# Patient Record
Sex: Female | Born: 1955 | Race: White | Hispanic: No | Marital: Married | Smoking: Former smoker
Health system: Southern US, Community
[De-identification: ages and names within clinical notes are randomized; demographics above are authoritative.]

## PROBLEM LIST (undated history)

## (undated) DIAGNOSIS — I8393 Asymptomatic varicose veins of bilateral lower extremities: Secondary | ICD-10-CM

## (undated) DIAGNOSIS — I10 Essential (primary) hypertension: Secondary | ICD-10-CM

## (undated) DIAGNOSIS — F419 Anxiety disorder, unspecified: Secondary | ICD-10-CM

## (undated) HISTORY — PX: OOPHORECTOMY: SHX86

## (undated) HISTORY — DX: Asymptomatic varicose veins of bilateral lower extremities: I83.93

## (undated) HISTORY — PX: HERNIA REPAIR: SHX51

## (undated) HISTORY — PX: VARICOSE VEIN SURGERY: SHX832

---

## 2016-04-12 ENCOUNTER — Emergency Department (HOSPITAL_BASED_OUTPATIENT_CLINIC_OR_DEPARTMENT_OTHER)
Admission: EM | Admit: 2016-04-12 | Discharge: 2016-04-12 | Disposition: A | Discharge status: Home / Self Care | Payer: BLUE CROSS/BLUE SHIELD | Attending: Emergency Medicine | Admitting: Emergency Medicine

## 2016-04-12 ENCOUNTER — Emergency Department (HOSPITAL_BASED_OUTPATIENT_CLINIC_OR_DEPARTMENT_OTHER): Payer: BLUE CROSS/BLUE SHIELD

## 2016-04-12 ENCOUNTER — Encounter (HOSPITAL_BASED_OUTPATIENT_CLINIC_OR_DEPARTMENT_OTHER): Payer: Self-pay | Admitting: Emergency Medicine

## 2016-04-12 DIAGNOSIS — G8929 Other chronic pain: Secondary | ICD-10-CM | POA: Diagnosis not present

## 2016-04-12 DIAGNOSIS — Z9889 Other specified postprocedural states: Secondary | ICD-10-CM | POA: Insufficient documentation

## 2016-04-12 DIAGNOSIS — R1032 Left lower quadrant pain: Secondary | ICD-10-CM | POA: Diagnosis not present

## 2016-04-12 DIAGNOSIS — R140 Abdominal distension (gaseous): Secondary | ICD-10-CM | POA: Insufficient documentation

## 2016-04-12 DIAGNOSIS — M549 Dorsalgia, unspecified: Secondary | ICD-10-CM | POA: Diagnosis not present

## 2016-04-12 DIAGNOSIS — I10 Essential (primary) hypertension: Secondary | ICD-10-CM | POA: Insufficient documentation

## 2016-04-12 DIAGNOSIS — Z91040 Latex allergy status: Secondary | ICD-10-CM | POA: Diagnosis not present

## 2016-04-12 DIAGNOSIS — Z79899 Other long term (current) drug therapy: Secondary | ICD-10-CM | POA: Diagnosis not present

## 2016-04-12 DIAGNOSIS — Z793 Long term (current) use of hormonal contraceptives: Secondary | ICD-10-CM | POA: Insufficient documentation

## 2016-04-12 DIAGNOSIS — R1031 Right lower quadrant pain: Secondary | ICD-10-CM | POA: Diagnosis present

## 2016-04-12 DIAGNOSIS — R10814 Left lower quadrant abdominal tenderness: Secondary | ICD-10-CM

## 2016-04-12 HISTORY — DX: Essential (primary) hypertension: I10

## 2016-04-12 LAB — URINALYSIS, ROUTINE W REFLEX MICROSCOPIC
BILIRUBIN URINE: NEGATIVE
GLUCOSE, UA: NEGATIVE mg/dL
Hgb urine dipstick: NEGATIVE
Ketones, ur: NEGATIVE mg/dL
LEUKOCYTES UA: NEGATIVE
Nitrite: NEGATIVE
PROTEIN: NEGATIVE mg/dL
Specific Gravity, Urine: 1.014 (ref 1.005–1.030)
pH: 7 (ref 5.0–8.0)

## 2016-04-12 LAB — CBC WITH DIFFERENTIAL/PLATELET
BASOS PCT: 0 %
Basophils Absolute: 0 10*3/uL (ref 0.0–0.1)
Eosinophils Absolute: 0.1 10*3/uL (ref 0.0–0.7)
Eosinophils Relative: 1 %
HEMATOCRIT: 37.8 % (ref 36.0–46.0)
HEMOGLOBIN: 13.2 g/dL (ref 12.0–15.0)
Lymphocytes Relative: 12 %
Lymphs Abs: 1 10*3/uL (ref 0.7–4.0)
MCH: 34.1 pg — ABNORMAL HIGH (ref 26.0–34.0)
MCHC: 34.9 g/dL (ref 30.0–36.0)
MCV: 97.7 fL (ref 78.0–100.0)
Monocytes Absolute: 0.8 10*3/uL (ref 0.1–1.0)
Monocytes Relative: 10 %
NEUTROS ABS: 6.2 10*3/uL (ref 1.7–7.7)
NEUTROS PCT: 77 %
Platelets: 210 10*3/uL (ref 150–400)
RBC: 3.87 MIL/uL (ref 3.87–5.11)
RDW: 12 % (ref 11.5–15.5)
WBC: 8 10*3/uL (ref 4.0–10.5)

## 2016-04-12 LAB — COMPREHENSIVE METABOLIC PANEL
ALK PHOS: 53 U/L (ref 38–126)
ALT: 23 U/L (ref 14–54)
ANION GAP: 7 (ref 5–15)
AST: 26 U/L (ref 15–41)
Albumin: 3.8 g/dL (ref 3.5–5.0)
BUN: 15 mg/dL (ref 6–20)
CALCIUM: 9 mg/dL (ref 8.9–10.3)
CHLORIDE: 101 mmol/L (ref 101–111)
CO2: 26 mmol/L (ref 22–32)
Creatinine, Ser: 0.69 mg/dL (ref 0.44–1.00)
Glucose, Bld: 109 mg/dL — ABNORMAL HIGH (ref 65–99)
Potassium: 3.8 mmol/L (ref 3.5–5.1)
Sodium: 134 mmol/L — ABNORMAL LOW (ref 135–145)
Total Bilirubin: 1.1 mg/dL (ref 0.3–1.2)
Total Protein: 7.4 g/dL (ref 6.5–8.1)

## 2016-04-12 MED ORDER — IBUPROFEN 400 MG PO TABS
600.0000 mg | ORAL_TABLET | Freq: Once | ORAL | Status: AC
  Administered 2016-04-12: 600 mg via ORAL
  Filled 2016-04-12: qty 1

## 2016-04-12 MED ORDER — IOPAMIDOL (ISOVUE-300) INJECTION 61%
100.0000 mL | Freq: Once | INTRAVENOUS | Status: AC | PRN
  Administered 2016-04-12: 100 mL via INTRAVENOUS

## 2016-04-12 MED ORDER — ACIDOPHILUS PROBIOTIC 10 MG PO TABS: 10.0000 mg | ORAL_TABLET | Freq: Three times a day (TID) | ORAL | Status: AC

## 2016-04-12 NOTE — Discharge Instructions (Signed)
Abdominal Pain, Adult °Many things can cause abdominal pain. Usually, abdominal pain is not caused by a disease and will improve without treatment. It can often be observed and treated at home. Your health care provider will do a physical exam and possibly order blood tests and X-rays to help determine the seriousness of your pain. However, in many cases, more time must pass before a clear cause of the pain can be found. Before that point, your health care provider may not know if you need more testing or further treatment. °HOME CARE INSTRUCTIONS °Monitor your abdominal pain for any changes. The following actions may help to alleviate any discomfort you are experiencing: °· Only take over-the-counter or prescription medicines as directed by your health care provider. °· Do not take laxatives unless directed to do so by your health care provider. °· Try a clear liquid diet (broth, tea, or water) as directed by your health care provider. Slowly move to a bland diet as tolerated. °SEEK MEDICAL CARE IF: °· You have unexplained abdominal pain. °· You have abdominal pain associated with nausea or diarrhea. °· You have pain when you urinate or have a bowel movement. °· You experience abdominal pain that wakes you in the night. °· You have abdominal pain that is worsened or improved by eating food. °· You have abdominal pain that is worsened with eating fatty foods. °· You have a fever. °SEEK IMMEDIATE MEDICAL CARE IF: °· Your pain does not go away within 2 hours. °· You keep throwing up (vomiting). °· Your pain is felt only in portions of the abdomen, such as the right side or the left lower portion of the abdomen. °· You pass bloody or black tarry stools. °MAKE SURE YOU: °· Understand these instructions. °· Will watch your condition. °· Will get help right away if you are not doing well or get worse. °  °This information is not intended to replace advice given to you by your health care provider. Make sure you discuss  any questions you have with your health care provider. °  °Document Released: 09/17/2005 Document Revised: 08/29/2015 Document Reviewed: 08/17/2013 °Elsevier Interactive Patient Education ©2016 [employer] ° °Diarrhea °Diarrhea is frequent loose and watery bowel movements. It can cause you to feel weak and dehydrated. Dehydration can cause you to become tired and thirsty, have a dry mouth, and have decreased urination that often is dark yellow. Diarrhea is a sign of another problem, most often an infection that will not last long. In most cases, diarrhea typically lasts 2-3 days. However, it can last longer if it is a sign of something more serious. It is important to treat your diarrhea as directed by your caregiver to lessen or prevent future episodes of diarrhea. °CAUSES  °Some common causes include: °· Gastrointestinal infections caused by viruses, bacteria, or parasites. °· Food poisoning or food allergies. °· Certain medicines, such as antibiotics, chemotherapy, and laxatives. °· Artificial sweeteners and fructose. °· Digestive disorders. °HOME CARE INSTRUCTIONS °· Ensure adequate fluid intake (hydration): Have 1 cup (8 oz) of fluid for each diarrhea episode. Avoid fluids that contain simple sugars or sports drinks, fruit juices, whole milk products, and sodas. Your urine should be clear or pale yellow if you are drinking enough fluids. Hydrate with an oral rehydration solution that you can purchase at pharmacies, retail stores, and online. You can prepare an oral rehydration solution at home by mixing the following ingredients together: °¨  - tsp table salt. °¨ ¾ tsp baking soda. °¨    tsp salt substitute containing potassium chloride. °¨ 1  tablespoons sugar. °¨ 1 L (34 oz) of water. °· Certain foods and beverages may increase the speed at which food moves through the gastrointestinal (GI) tract. These foods and beverages should be avoided and include: °¨ Caffeinated and alcoholic beverages. °¨ High-fiber  foods, such as raw fruits and vegetables, nuts, seeds, and whole grain breads and cereals. °¨ Foods and beverages sweetened with sugar alcohols, such as xylitol, sorbitol, and mannitol. °· Some foods may be well tolerated and may help thicken stool including: °¨ Starchy foods, such as rice, toast, pasta, low-sugar cereal, oatmeal, grits, baked potatoes, crackers, and bagels. °¨ Bananas. °¨ Applesauce. °· Add probiotic-rich foods to help increase healthy bacteria in the GI tract, such as yogurt and fermented milk products. °· Wash your hands well after each diarrhea episode. °· Only take over-the-counter or prescription medicines as directed by your caregiver. °· Take a warm bath to relieve any burning or pain from frequent diarrhea episodes. °SEEK IMMEDIATE MEDICAL CARE IF:  °· You are unable to keep fluids down. °· You have persistent vomiting. °· You have blood in your stool, or your stools are black and tarry. °· You do not urinate in 6-8 hours, or there is only a small amount of very dark urine. °· You have abdominal pain that increases or localizes. °· You have weakness, dizziness, confusion, or light-headedness. °· You have a severe headache. °· Your diarrhea gets worse or does not get better. °· You have a fever or persistent symptoms for more than 2-3 days. °· You have a fever and your symptoms suddenly get worse. °MAKE SURE YOU:  °· Understand these instructions. °· Will watch your condition. °· Will get help right away if you are not doing well or get worse. °  °This information is not intended to replace advice given to you by your health care provider. Make sure you discuss any questions you have with your health care provider. °  °Document Released: 11/28/2002 Document Revised: 12/29/2014 Document Reviewed: 08/15/2012 °Elsevier Interactive Patient Education ©2016 [employer] Inc. ° °

## 2016-04-12 NOTE — ED Notes (Addendum)
Pt with RLQ abdominal pain with "bloating", pt states she took a laxative yesterday with results but no improvement in pain, pt states if she pushes in on RLQ area she has sharp immediate pains that radiate to back, history of ovarian cyst and bulging disk

## 2016-04-12 NOTE — ED Provider Notes (Signed)
CSN: [id number]     Arrival date & time 04/12/16  0856 History   First MD Initiated Contact with Patient 04/12/16 [phone]     Chief Complaint  Patient presents with  . Abdominal Pain    Amber Pratt is a 60 y.o. female who presents to the ED complaining of right lower quadrant abdominal pain for the past week that has worsened last night. She reports 5/10 pain currently that is worse with touching or movement. She also reports abdominal bloating. She has taken nothing for treatment today. She reports a previous abdominal surgical history of 3 cesarean sections and a hernia repair. She reports taking a laxative yesterday that helped with the bloating and her pain. Her last bowel movement was this morning. She denies fevers, nausea, vomiting, diarrhea, hematemesis, hematochezia, rashes, vaginal bleeding, vaginal discharge, coughing, or SOB.   HPI  Past Medical History  Diagnosis Date  . Hypertension    History reviewed. No pertinent past surgical history. No family history on file. Social History  Substance Use Topics  . Smoking status: None  . Smokeless tobacco: None  . Alcohol Use: None   OB History    No data available     Review of Systems  Constitutional: Negative for fever and chills.  HENT: Negative for congestion and sore throat.   Eyes: Negative for visual disturbance.  Respiratory: Negative for cough, shortness of breath and wheezing.   Cardiovascular: Negative for chest pain and palpitations.  Gastrointestinal: Positive for abdominal pain. Negative for nausea, vomiting, diarrhea and blood in stool.  Genitourinary: Negative for dysuria, urgency, frequency, hematuria, flank pain, decreased urine volume, vaginal bleeding, vaginal discharge and difficulty urinating.  Musculoskeletal: Positive for back pain (chronic ). Negative for neck pain.  Skin: Negative for rash.  Neurological: Negative for headaches.      Allergies  Cephalosporins; Latex; and Sulfa  antibiotics  Home Medications   Prior to Admission medications   Medication Sig Start Date End Date Taking? Authorizing Provider  estradiol (ESTRACE) 0.5 MG tablet Take 0.5 mg by mouth daily.   Yes Historical Provider, MD  FLUoxetine (PROZAC) 20 MG tablet Take 20 mg by mouth daily.   Yes Historical Provider, MD  hydrochlorothiazide (HYDRODIURIL) 25 MG tablet Take 25 mg by mouth daily.   Yes Historical Provider, MD  ibuprofen (ADVIL,MOTRIN) 200 MG tablet Take 200 mg by mouth every 6 (six) hours as needed.   Yes Historical Provider, MD  Lactobacillus (ACIDOPHILUS PROBIOTIC) 10 MG TABS Take 10 mg by mouth 3 (three) times daily. 04/12/16   Everlene Farrier, PA-C   BP 138/90 mmHg  Pulse 78  Temp(Src) 98.2 F (36.8 C) (Oral)  Resp 18  SpO2 100% Physical Exam  Constitutional: She appears well-developed and well-nourished. No distress.  Nontoxic appearing.  HENT:  Head: Normocephalic and atraumatic.  Mouth/Throat: Oropharynx is clear and moist.  Eyes: Conjunctivae are normal. Pupils are equal, round, and reactive to light. Right eye exhibits no discharge. Left eye exhibits no discharge.  Neck: Neck supple.  Cardiovascular: Normal rate, regular rhythm, normal heart sounds and intact distal pulses.  Exam reveals no gallop and no friction rub.   No murmur heard. Pulmonary/Chest: Effort normal and breath sounds normal. No respiratory distress. She has no wheezes. She has no rales.  Abdominal: Soft. Bowel sounds are normal. She exhibits no distension and no mass. There is tenderness. There is rebound. There is no guarding.  Patient's abdomen is soft bowel sounds are present. Patient has right lower quadrant  tenderness to palpation. She also has left lower quadrant tenderness to palpation. No CVA or flank tenderness.  Musculoskeletal: She exhibits no edema.  Lymphadenopathy:    She has no cervical adenopathy.  Neurological: She is alert. Coordination normal.  Skin: Skin is warm and dry. No rash  noted. She is not diaphoretic. No erythema. No pallor.  Psychiatric: She has a normal mood and affect. Her behavior is normal.  Nursing note and vitals reviewed.   ED Course  Procedures (including critical care time) Labs Review Labs Reviewed  COMPREHENSIVE METABOLIC PANEL - Abnormal; Notable for the following:    Sodium 134 (*)    Glucose, Bld 109 (*)    All other components within normal limits  CBC WITH DIFFERENTIAL/PLATELET - Abnormal; Notable for the following:    MCH 34.1 (*)    All other components within normal limits  URINALYSIS, ROUTINE W REFLEX MICROSCOPIC (NOT AT [facility])    Imaging Review Ct Abdomen Pelvis W Contrast  04/12/2016  CLINICAL DATA:  Right lower quadrant pain for several days. Bloating. EXAM: CT ABDOMEN AND PELVIS WITH CONTRAST TECHNIQUE: Multidetector CT imaging of the abdomen and pelvis was performed using the standard protocol following bolus administration of intravenous contrast. CONTRAST:  ISOVUE-300 IOPAMIDOL (ISOVUE-300) INJECTION 61% COMPARISON:  None. FINDINGS: Lower chest:  No acute findings. Hepatobiliary: No masses or other significant abnormality. Gallbladder is unremarkable. Pancreas: No mass, inflammatory changes, or other significant abnormality. Spleen: Within normal limits in size and appearance. Adrenals/Urinary Tract: No masses identified. No evidence of hydronephrosis. Stomach/Bowel: No evidence of obstruction, inflammatory process, or abnormal fluid collections. Normal appendix visualized. Vascular/Lymphatic: No pathologically enlarged lymph nodes. No evidence of abdominal aortic aneurysm. Reproductive: No mass or other significant abnormality. Other: None. Musculoskeletal:  No suspicious bone lesions identified. IMPRESSION: Negative. No evidence of appendicitis or other significant abnormality identified within the abdomen or pelvis. Electronically Signed   By: Myles Rosenthal M.D.   On: 04/12/2016 12:03   I have personally reviewed and evaluated  these images and lab results as part of my medical decision-making.   EKG Interpretation None      Filed Vitals:   04/12/16 0948 04/12/16 1213  BP: 120/99 138/90  Pulse: 88 78  Temp: 98.2 F (36.8 C)   TempSrc: Oral   Resp: 18 18  SpO2: 98% 100%     MDM   Meds given in ED:  Medications  iopamidol (ISOVUE-300) 61 % injection 100 mL (100 mLs Intravenous Contrast Given 04/12/16 1131)  ibuprofen (ADVIL,MOTRIN) tablet 600 mg (600 mg Oral Given 04/12/16 1232)    New Prescriptions   LACTOBACILLUS (ACIDOPHILUS PROBIOTIC) 10 MG TABS    Take 10 mg by mouth 3 (three) times daily.    Final diagnoses:  Right lower quadrant abdominal pain  Left lower quadrant abdominal tenderness    This is a 59 y.o. female who presents to the ED complaining of right lower quadrant abdominal pain for the past week that has worsened last night. She reports 5/10 pain currently that is worse with touching or movement. She also reports abdominal bloating. She has taken nothing for treatment today. She reports a previous abdominal surgical history of 3 cesarean sections and a hernia repair. She reports taking a laxative yesterday that helped with the bloating and her pain. Her last bowel movement was this morning.  On exam the patient is afebrile and nontoxic appearing. Her abdomen is soft and she has moderate left lower quadrant and right lower quadrant tenderness to  palpation. No peritoneal signs. CMP and CBC are unremarkable. Urinalysis is negative for infection. No hematuria. CT abdomen and pelvis is unremarkable. While in the emergency room the patient did have one loose stool. This may be related to her laxative or she may have a diarrheal illness. CT scan is unremarkable. I will provide her a prescription for a probiotic and have her follow closely with primary care and her gastroenterologist. I have low suspicion for ovarian torsion due to history and physical exam. Patient has also had pain for 5 days. I see  no need for further emergent workup at this time. Patient feels comfortable with discharge. I advised the patient to follow-up with their primary care provider this week. I advised the patient to return to the emergency department with new or worsening symptoms or new concerns. The patient verbalized understanding and agreement with plan.   This patient was discussed with and evaluated by Dr. Criss AlvineGoldston who agrees with assessment and plan.     Everlene FarrierWilliam Sharonna Vinje, PA-C 04/12/16 1238  Pricilla LovelessScott Goldston, MD 04/12/16 [phone]

## 2018-10-06 ENCOUNTER — Other Ambulatory Visit: Payer: Self-pay

## 2018-10-06 MED ORDER — SERTRALINE HCL 100 MG PO TABS: 200.0000 mg | ORAL_TABLET | Freq: Every day | ORAL | 0 refills | Status: DC

## 2018-10-26 ENCOUNTER — Ambulatory Visit: Payer: Self-pay | Admitting: Psychiatry

## 2018-10-27 ENCOUNTER — Ambulatory Visit: Payer: BLUE CROSS/BLUE SHIELD | Admitting: Psychiatry

## 2018-10-27 ENCOUNTER — Encounter: Payer: Self-pay | Admitting: Psychiatry

## 2018-10-27 VITALS — BP 118/72 | HR 69

## 2018-10-27 DIAGNOSIS — F331 Major depressive disorder, recurrent, moderate: Secondary | ICD-10-CM

## 2018-10-27 DIAGNOSIS — F422 Mixed obsessional thoughts and acts: Secondary | ICD-10-CM | POA: Diagnosis not present

## 2018-10-27 MED ORDER — CLORAZEPATE DIPOTASSIUM 7.5 MG PO TABS: 3.7500 mg | ORAL_TABLET | Freq: Two times a day (BID) | ORAL | 2 refills | Status: DC | PRN

## 2018-10-27 MED ORDER — VENLAFAXINE HCL ER 75 MG PO CP24: ORAL_CAPSULE | ORAL | Status: DC

## 2018-10-27 MED ORDER — SERTRALINE HCL 100 MG PO TABS: ORAL_TABLET | ORAL | 0 refills | Status: DC

## 2018-10-27 NOTE — Progress Notes (Signed)
Amber Pratt [id number] 12/25/1955 62 y.o.  Subjective:   Patient ID:  Amber Pratt is a 62 y.o. (DOB Oct 22, 1956) female.  Chief Complaint:  Chief Complaint  Patient presents with  . Depression  . Anxiety    HPI Amber Pratt presents to the office today for follow-up of depression and anxiety. She reports that her mood and anxiety have been unimproved with Sertraline. She reports that she had severe diarrhea while taking Zoloft and lost 15-18 lbs. She reports that diarrhea has improved slightly with recent dose decreases. She reports that her mother takes Paxil and sister takes Effexor XR, and another sister takes Prozac, all with good response. She reports that her PCP prescribed Venlafaxine XR and she has not yet started this.   She reports "the anxiety is getting better but I am just depressed." She reports that she awakens with anxiety and depression , and this starts to improve by mid-day. She reports that decreased social interaction after move has had a negative impact on her mood. Reports that she has been feeling "shaky" at times with anxiety. Denies any recent palpitations with anxiety. Denies any full blown panic attacks. Describes feeling anxiety "in my gut" with a nervous feeling. She reports worry and rumination. Fears that husband will leave her because "I can't get it together," despite her husband being very supportive. She reports frequent rumination about current medications not working. She reports that she has been walking about 4 days a week and that this helpful for mood and overall well being. Reports energy is low at times and will need to take naps. She reports having to push herself to do housework. She reports poor concentration with increased anxiety. Denies SI.   Past medication trials: Sertraline-ineffective, diarrhea Prozac-effective for 20 years and then no longer effective. Anafranil-patient reports that it "made me  loopy." BuSpar-helpful Clorazepate-effective  Review of Systems:  Review of Systems  Constitutional: Positive for unexpected weight change.  HENT:       Reports that right eye lid has been swollen upon awakening the last few mornings.   Gastrointestinal: Positive for diarrhea.  Musculoskeletal: Negative for gait problem.  Neurological: Negative for tremors.  Psychiatric/Behavioral:       Please refer to HPI    Medications: I have reviewed the patient's current medications.  Current Outpatient Medications  Medication Sig Dispense Refill  . Ascorbic Acid (VITAMIN C) 100 MG tablet Take 100 mg by mouth daily.    Marland Kitchen aspirin EC 81 MG tablet Take 81 mg by mouth daily.    . busPIRone (BUSPAR) 30 MG tablet Take 30 mg by mouth 2 (two) times daily.    . calcium carbonate (OS-CAL - DOSED IN MG OF ELEMENTAL CALCIUM) 1250 (500 Ca) MG tablet Take 1 tablet by mouth.    Melene Muller ON 11/18/2018] clorazepate (TRANXENE) 7.5 MG tablet Take 0.5 tablets (3.75 mg total) by mouth 2 (two) times daily as needed for anxiety. 30 tablet 2  . ibuprofen (ADVIL,MOTRIN) 200 MG tablet Take 200 mg by mouth every 6 (six) hours as needed.    . Lactobacillus (ACIDOPHILUS PROBIOTIC) 10 MG TABS Take 10 mg by mouth 3 (three) times daily. 30 tablet 0  . Multiple Vitamins-Minerals (HAIR SKIN AND NAILS FORMULA PO) Take by mouth.    . olmesartan (BENICAR) 20 MG tablet   1  . Omega-3 Fatty Acids (FISH OIL) 1000 MG CAPS Take by mouth.    . Vitamin D, Cholecalciferol, 10 MCG (400 UNIT) CAPS Take by mouth.    Marland Kitchen  vitamin E 100 UNIT capsule Take by mouth daily.    Marland Kitchen estradiol (ESTRACE) 0.5 MG tablet Take 0.5 mg by mouth daily.    . hydrochlorothiazide (HYDRODIURIL) 25 MG tablet Take 25 mg by mouth daily.    . sertraline (ZOLOFT) 100 MG tablet Take 1/2 tab x 7 days, then stop 5 tablet 0  . venlafaxine XR (EFFEXOR XR) 75 MG 24 hr capsule Take 1 capsule (75 mg total) by mouth daily with breakfast for 7 days, THEN 2 capsules (150 mg total)  daily with breakfast.     No current facility-administered medications for this visit.     Medication Side Effects: Other: Diarrhea  Allergies:  Allergies  Allergen Reactions  . Cephalosporins   . Latex   . Sulfa Antibiotics     Past Medical History:  Diagnosis Date  . Hypertension   . Varicose veins of both lower extremities     Family History  Problem Relation Age of Onset  . Anxiety disorder Mother   . Depression Mother   . Alcohol abuse Father   . Alcohol abuse Maternal Aunt   . Schizophrenia Maternal Uncle   . Anxiety disorder Sister   . Depression Sister   . Alcohol abuse Sister   . Anxiety disorder Sister   . Depression Sister     Social History   Socioeconomic History  . Marital status: Married    Spouse name: Not on file  . Number of children: Not on file  . Years of education: Not on file  . Highest education level: Not on file  Occupational History  . Not on file  Social Needs  . Financial resource strain: Not on file  . Food insecurity:    Worry: Not on file    Inability: Not on file  . Transportation needs:    Medical: Not on file    Non-medical: Not on file  Tobacco Use  . Smoking status: Former Games developer  . Smokeless tobacco: Never Used  Substance and Sexual Activity  . Alcohol use: Not on file  . Drug use: Not on file  . Sexual activity: Not on file  Lifestyle  . Physical activity:    Days per week: Not on file    Minutes per session: Not on file  . Stress: Not on file  Relationships  . Social connections:    Talks on phone: Not on file    Gets together: Not on file    Attends religious service: Not on file    Active member of club or organization: Not on file    Attends meetings of clubs or organizations: Not on file    Relationship status: Not on file  . Intimate partner violence:    Fear of current or ex partner: Not on file    Emotionally abused: Not on file    Physically abused: Not on file    Forced sexual activity: Not  on file  Other Topics Concern  . Not on file  Social History Narrative  . Not on file    Past Medical History, Surgical history, Social history, and Family history were reviewed and updated as appropriate.   Please see review of systems for further details on the patient's review from today.   Objective:   Physical Exam:  BP 118/72   Pulse 69   Physical Exam  Constitutional: She is oriented to person, place, and time. She appears well-developed. No distress.  Musculoskeletal: She exhibits no deformity.  Neurological: She is  alert and oriented to person, place, and time. Coordination normal.  Psychiatric: Her speech is normal and behavior is normal. Judgment and thought content normal. Her mood appears anxious. Her affect is not angry, not blunt, not labile and not inappropriate. Cognition and memory are normal. She exhibits a depressed mood. She expresses no homicidal and no suicidal ideation. She expresses no suicidal plans and no homicidal plans.  Insight intact. No auditory or visual hallucinations. No delusions.     Lab Review:     Component Value Date/Time   NA 134 (L) 04/12/2016 0930   K 3.8 04/12/2016 0930   CL 101 04/12/2016 0930   CO2 26 04/12/2016 0930   GLUCOSE 109 (H) 04/12/2016 0930   BUN 15 04/12/2016 0930   CREATININE 0.69 04/12/2016 0930   CALCIUM 9.0 04/12/2016 0930   PROT 7.4 04/12/2016 0930   ALBUMIN 3.8 04/12/2016 0930   AST 26 04/12/2016 0930   ALT 23 04/12/2016 0930   ALKPHOS 53 04/12/2016 0930   BILITOT 1.1 04/12/2016 0930   GFRNONAA >60 04/12/2016 0930   GFRAA >60 04/12/2016 0930       Component Value Date/Time   WBC 8.0 04/12/2016 0930   RBC 3.87 04/12/2016 0930   HGB 13.2 04/12/2016 0930   HCT 37.8 04/12/2016 0930   PLT 210 04/12/2016 0930   MCV 97.7 04/12/2016 0930   MCH 34.1 (H) 04/12/2016 0930   MCHC 34.9 04/12/2016 0930   RDW 12.0 04/12/2016 0930   LYMPHSABS 1.0 04/12/2016 0930   MONOABS 0.8 04/12/2016 0930   EOSABS 0.1  04/12/2016 0930   BASOSABS 0.0 04/12/2016 0930    No results found for: POCLITH, LITHIUM   No results found for: PHENYTOIN, PHENOBARB, VALPROATE, CBMZ   .res Assessment: Plan:   Case staffed with Dr. Jennelle Human.  Patient seen for 30 minutes and greater than 50% of visit spent counseling patient regarding potential benefits, risks, and side effects of possible treatment options to include Effexor and Paxil.  Patient reports that she would prefer to start with Effexor XR.  Divided patient with verbal and written instructions regarding cross titration from sertraline to Effexor XR.  Discussed that it would likely take 4 to 5 weeks before she can determine full response from Effexor and that higher doses may be needed.  She is advised to contact office with any tolerability issues. Encouraged pt to contact previous therapist, Bradley Ferris, [facility] to resume therapy.   Mixed obsessional thoughts and acts - Plan: sertraline (ZOLOFT) 100 MG tablet, venlafaxine XR (EFFEXOR XR) 75 MG 24 hr capsule, clorazepate (TRANXENE) 7.5 MG tablet  Moderate episode of recurrent major depressive disorder (HCC) - Plan: sertraline (ZOLOFT) 100 MG tablet, venlafaxine XR (EFFEXOR XR) 75 MG 24 hr capsule  Please see After Visit Summary for patient specific instructions.  Future Appointments  Date Time Provider Department Center  12/01/2018  9:30 AM Corie Chiquito, PMHNP [facility] None    No orders of the defined types were placed in this encounter.     -------------------------------

## 2018-10-27 NOTE — Patient Instructions (Signed)
Decrease Sertraline to 1/2 tablet daily for one week, and then stop.  Start Effexor XR (Venlafaxine ER) 1 capsule daily for one week (while also taking Sertraline 1/2 tab), then increase to 2 capsules daily.

## 2018-11-17 ENCOUNTER — Encounter: Payer: Self-pay | Admitting: Emergency Medicine

## 2018-11-17 DIAGNOSIS — F331 Major depressive disorder, recurrent, moderate: Secondary | ICD-10-CM | POA: Insufficient documentation

## 2018-11-17 DIAGNOSIS — F422 Mixed obsessional thoughts and acts: Secondary | ICD-10-CM | POA: Insufficient documentation

## 2018-11-24 ENCOUNTER — Other Ambulatory Visit: Payer: Self-pay

## 2018-11-24 MED ORDER — BUSPIRONE HCL 30 MG PO TABS: ORAL_TABLET | ORAL | 1 refills | Status: DC

## 2018-12-01 ENCOUNTER — Encounter: Payer: Self-pay | Admitting: Psychiatry

## 2018-12-01 ENCOUNTER — Ambulatory Visit: Payer: BLUE CROSS/BLUE SHIELD | Admitting: Psychiatry

## 2018-12-01 VITALS — BP 131/71 | HR 82

## 2018-12-01 DIAGNOSIS — F902 Attention-deficit hyperactivity disorder, combined type: Secondary | ICD-10-CM

## 2018-12-01 DIAGNOSIS — F422 Mixed obsessional thoughts and acts: Secondary | ICD-10-CM | POA: Diagnosis not present

## 2018-12-01 DIAGNOSIS — F3341 Major depressive disorder, recurrent, in partial remission: Secondary | ICD-10-CM | POA: Diagnosis not present

## 2018-12-01 MED ORDER — BUSPIRONE HCL 30 MG PO TABS: 30.0000 mg | ORAL_TABLET | Freq: Two times a day (BID) | ORAL | 1 refills | Status: DC

## 2018-12-01 MED ORDER — VENLAFAXINE HCL ER 75 MG PO CP24: ORAL_CAPSULE | ORAL | 0 refills | Status: DC

## 2018-12-01 MED ORDER — CLORAZEPATE DIPOTASSIUM 7.5 MG PO TABS: ORAL_TABLET | ORAL | 2 refills | Status: DC

## 2018-12-01 NOTE — Progress Notes (Signed)
[name] [id number] 27-Dec-1955 62 y.o.  Subjective:   Patient ID:  Amber Pratt is a 62 y.o. (DOB 09-15-1956) female.  Chief Complaint:  Chief Complaint  Patient presents with  . Anxiety  . Follow-up    Depression    HPI Amber Pratt presents to the office today for follow-up of anxiety and depression. She reports "I'm a little better." reports that in the morning she wakes up feeling overwhelmed and thinking about all the things she needs to do. Reports that she will experience increased heart rate, increased muscle tension, and some chest tightness. She reports that these s/s seem to resolve when she is around people, such as when she goes to exercise. She reports that "usually by afternoon I am a lot better and by night time, I am fine." Reports that she is trying not to focus on the anxiety and is better able to redirect these thoughts more easily. . Notices some worry about her children at times. Reports that she is no longer as anxious about being home alone while husband is away. Denies any recent intrusive thoughts- "that part of it is a lot better."    Reports that sad mood is "better." Reports that she is no longer experiencing affective dulling and is now able to cry and find this helpful and feels that crying is a release. She reports that her mood is now stable. She reports that she is sleeping well. Reports that she slept better on Sertraline and now has mild difficulty falling asleep. She reports some decrease in her food intake/appetite. She reports energy and motivation have improved. She has joined [employer] and has been working out and visiting family members. Denies SI.   She reports that her child has ADHD. Pt reports that she now questions if she has ADD since she has had long-standing difficulty with concnetration and focus. Recalls frequently looking out the window as a child in school. Reports that she was always fidgeting and would get in trouble for talking in  class and not staying in her seat. She reports that she would get easily distracted and start homework and not complete it.   Notices in the morning she will jump from one task to the other. Reports that she is able to focus on things she is interested in. Procrastinates and especially puts off tasks that are more detailed or not as interesting. Reports that she frequently loses her car keys. Denies difficulty interrupting others, but will ask friends to remind her to tell them something when they are done talking. Reports that she frequently taps her feet. She reports that she will get overwhelmed by certain tasks or knowing where to begin and then will go out and shop instead.   Will be staying in [place] in January.   Past medication trials: Sertraline-ineffective, diarrhea Prozac-effective for 20 years and then no longer effective. Effexor XR Anafranil-patient reports that it "made me loopy." BuSpar-helpful Clorazepate-effective  Review of Systems:  Review of Systems  HENT:       Dry eyes  Eyes: Positive for discharge.  Gastrointestinal: Negative.   Musculoskeletal: Negative for gait problem.       Notices some muscle tension in left shoulder.  Neurological: Negative for tremors.  Psychiatric/Behavioral:       Please refer to HPI    Medications: I have reviewed the patient's current medications.  Current Outpatient Medications  Medication Sig Dispense Refill  . Ascorbic Acid (VITAMIN C) 100 MG tablet Take 100 mg  by mouth daily.    Marland Kitchen aspirin EC 81 MG tablet Take 81 mg by mouth daily.    . busPIRone (BUSPAR) 30 MG tablet Take 1 tablet (30 mg total) by mouth 2 (two) times daily. Take 1 Tablet by mouth Twice daily. 180 tablet 1  . calcium carbonate (OS-CAL - DOSED IN MG OF ELEMENTAL CALCIUM) 1250 (500 Ca) MG tablet Take 1 tablet by mouth.    . clorazepate (TRANXENE) 7.5 MG tablet Take 1/2-1 po BID prn anxiety 30 tablet 2  . estradiol (ESTRACE) 0.5 MG tablet Take 0.5 mg by mouth  daily.    . hydrochlorothiazide (HYDRODIURIL) 25 MG tablet Take 25 mg by mouth daily.    Marland Kitchen ibuprofen (ADVIL,MOTRIN) 200 MG tablet Take 200 mg by mouth every 6 (six) hours as needed.    . Lactobacillus (ACIDOPHILUS PROBIOTIC) 10 MG TABS Take 10 mg by mouth 3 (three) times daily. 30 tablet 0  . Multiple Vitamins-Minerals (HAIR SKIN AND NAILS FORMULA PO) Take by mouth.    . olmesartan (BENICAR) 20 MG tablet   1  . Omega-3 Fatty Acids (FISH OIL) 1000 MG CAPS Take by mouth.    . venlafaxine XR (EFFEXOR XR) 75 MG 24 hr capsule Take 1 capsule (75 mg total) by mouth daily with breakfast for 7 days, THEN 2 capsules (150 mg total) daily with breakfast.    . Vitamin D, Cholecalciferol, 10 MCG (400 UNIT) CAPS Take by mouth.    . vitamin E 100 UNIT capsule Take by mouth daily.    . propranolol (INDERAL) 10 MG tablet Take 10 mg by mouth 2 (two) times daily.    Marland Kitchen venlafaxine XR (EFFEXOR XR) 75 MG 24 hr capsule Take 2-3 capsule po q am 270 capsule 0   No current facility-administered medications for this visit.     Medication Side Effects: Other: Some decrease in sexual desire.   Allergies:  Allergies  Allergen Reactions  . Cephalosporins   . Latex   . Sulfa Antibiotics     Past Medical History:  Diagnosis Date  . Hypertension   . Varicose veins of both lower extremities     Family History  Problem Relation Age of Onset  . Anxiety disorder Mother   . Depression Mother   . Alcohol abuse Father   . Alcohol abuse Maternal Aunt   . Schizophrenia Maternal Uncle   . Anxiety disorder Sister   . Depression Sister   . Alcohol abuse Sister   . Anxiety disorder Sister   . Depression Sister     Social History   Socioeconomic History  . Marital status: Married    Spouse name: Not on file  . Number of children: Not on file  . Years of education: Not on file  . Highest education level: Not on file  Occupational History  . Not on file  Social Needs  . Financial resource strain: Not on file   . Food insecurity:    Worry: Not on file    Inability: Not on file  . Transportation needs:    Medical: Not on file    Non-medical: Not on file  Tobacco Use  . Smoking status: Former Games developer  . Smokeless tobacco: Never Used  Substance and Sexual Activity  . Alcohol use: Not on file  . Drug use: Not on file  . Sexual activity: Not on file  Lifestyle  . Physical activity:    Days per week: Not on file    Minutes per session:  Not on file  . Stress: Not on file  Relationships  . Social connections:    Talks on phone: Not on file    Gets together: Not on file    Attends religious service: Not on file    Active member of club or organization: Not on file    Attends meetings of clubs or organizations: Not on file    Relationship status: Not on file  . Intimate partner violence:    Fear of current or ex partner: Not on file    Emotionally abused: Not on file    Physically abused: Not on file    Forced sexual activity: Not on file  Other Topics Concern  . Not on file  Social History Narrative  . Not on file    Past Medical History, Surgical history, Social history, and Family history were reviewed and updated as appropriate.   Please see review of systems for further details on the patient's review from today.   Objective:   Physical Exam:  BP 131/71   Pulse 82   Physical Exam  Constitutional: She is oriented to person, place, and time. She appears well-developed. No distress.  Musculoskeletal: She exhibits no deformity.  Neurological: She is alert and oriented to person, place, and time. Coordination normal.  Psychiatric: Her speech is normal and behavior is normal. Judgment and thought content normal. Her affect is not angry, not blunt, not labile and not inappropriate. Cognition and memory are normal. She does not exhibit a depressed mood. She expresses no homicidal and no suicidal ideation. She expresses no suicidal plans and no homicidal plans.  Less anxious compared  to past anxious Insight intact. No auditory or visual hallucinations. No delusions.     Lab Review:     Component Value Date/Time   NA 134 (L) 04/12/2016 0930   K 3.8 04/12/2016 0930   CL 101 04/12/2016 0930   CO2 26 04/12/2016 0930   GLUCOSE 109 (H) 04/12/2016 0930   BUN 15 04/12/2016 0930   CREATININE 0.69 04/12/2016 0930   CALCIUM 9.0 04/12/2016 0930   PROT 7.4 04/12/2016 0930   ALBUMIN 3.8 04/12/2016 0930   AST 26 04/12/2016 0930   ALT 23 04/12/2016 0930   ALKPHOS 53 04/12/2016 0930   BILITOT 1.1 04/12/2016 0930   GFRNONAA >60 04/12/2016 0930   GFRAA >60 04/12/2016 0930       Component Value Date/Time   WBC 8.0 04/12/2016 0930   RBC 3.87 04/12/2016 0930   HGB 13.2 04/12/2016 0930   HCT 37.8 04/12/2016 0930   PLT 210 04/12/2016 0930   MCV 97.7 04/12/2016 0930   MCH 34.1 (H) 04/12/2016 0930   MCHC 34.9 04/12/2016 0930   RDW 12.0 04/12/2016 0930   LYMPHSABS 1.0 04/12/2016 0930   MONOABS 0.8 04/12/2016 0930   EOSABS 0.1 04/12/2016 0930   BASOSABS 0.0 04/12/2016 0930    No results found for: POCLITH, LITHIUM   No results found for: PHENYTOIN, PHENOBARB, VALPROATE, CBMZ   .res Assessment: Plan:   Patient seen for 30 minutes and greater than 50% of visit spent counseling patient regarding potential benefits, risks, and side effects of increasing Effexor XR to 225 mg daily versus continuing 150 mg daily since patient reports that she is continue to experience some gradual improvement in signs and symptoms and is uncertain if she is experiencing some sexual side effects.  Also discussed some concern about increasing medication while being away for a month and being unsure of effects of  new meds.  Will write prescription for Effexor XR 75 mg 2 to 3 capsules daily so that patient can continue Effexor XR 150 and milligrams daily and have option to increase to 225 mg in the future when traveling out of state if needed.  Advised patient to contact office if she has questions  about whether or not to increase dose. Continue BuSpar 30 mg twice daily for anxiety. Continue clorazepate for anxiety.  Recommended taking clorazepate immediately upon awakening to prevent escalation of anxiety and to treat anxiety signs and symptoms more aggressively for 7 to 10 days until anxiety stabilizes. Recurrent major depressive disorder, in partial remission (HCC) - Plan: venlafaxine XR (EFFEXOR XR) 75 MG 24 hr capsule  Mixed obsessional thoughts and acts - Plan: venlafaxine XR (EFFEXOR XR) 75 MG 24 hr capsule, busPIRone (BUSPAR) 30 MG tablet, clorazepate (TRANXENE) 7.5 MG tablet, DISCONTINUED: clorazepate (TRANXENE) 7.5 MG tablet  Attention deficit hyperactivity disorder (ADHD), combined type  Please see After Visit Summary for patient specific instructions.  Future Appointments  Date Time Provider Department Center  02/01/2019  9:30 AM Corie Chiquito, PMHNP [facility] None    No orders of the defined types were placed in this encounter.     -------------------------------

## 2018-12-02 MED ORDER — CLORAZEPATE DIPOTASSIUM 7.5 MG PO TABS: ORAL_TABLET | ORAL | 2 refills | Status: DC

## 2019-02-01 ENCOUNTER — Ambulatory Visit: Payer: BLUE CROSS/BLUE SHIELD | Admitting: Psychiatry

## 2019-02-01 ENCOUNTER — Encounter: Payer: Self-pay | Admitting: Psychiatry

## 2019-02-01 VITALS — BP 115/79 | HR 94

## 2019-02-01 DIAGNOSIS — F331 Major depressive disorder, recurrent, moderate: Secondary | ICD-10-CM | POA: Diagnosis not present

## 2019-02-01 DIAGNOSIS — F422 Mixed obsessional thoughts and acts: Secondary | ICD-10-CM

## 2019-02-01 DIAGNOSIS — F3341 Major depressive disorder, recurrent, in partial remission: Secondary | ICD-10-CM

## 2019-02-01 MED ORDER — VENLAFAXINE HCL ER 75 MG PO CP24: ORAL_CAPSULE | ORAL | 0 refills | Status: DC

## 2019-02-01 NOTE — Progress Notes (Signed)
Amber Pratt [id number] 1956-06-29 63 y.o.  Subjective:   Patient ID:  Amber Pratt is a 63 y.o. (DOB 1956/03/04) female.  Chief Complaint:  Chief Complaint  Patient presents with  . Depression  . Anxiety    HPI Amber Pratt presents to the office today for follow-up of depression and anxiety.   She reports that she is "a little better but I still have a ways to be where I want to be." She reports that her PCP increased her Effexor XR about 3 weeks ago. She reports that she is now taking 150 mg po q am and 75 mg po QHS. She reports that she is now sleeping more soundly and wanting to go to bed early. She reports improved sleep quality and duration and is now sleeping 9 hours a night and awakens once to take her dog out.   She reports that her anxiety is better. Reports that she continues to have some anxiety in the morning, particularly after watching the morning news. She reports that she continues to have "that naggy depression." She reports that her mood seems to be ok when she is out and around others and notices sad mood more when she is alone. Has joined the [facility] and has been exercising. Has been going to [facility] a few times a week to see friends and her mother. "This particular depression has lasted a little longer." She reports that her energy has "been fine." She reports that she is occasionally wanting to doze in the afternoon when reading. Motivation has been ok. Appetite has been good. She reports adequate concentration "as long as it is quiet" and reports that she is distracted if husband is talking on the phone in the background. "The depression is still there, but it is way lower." Denies SI.  Some worry about her 78 yo son who has had several head injuries and has mentioned that he has been having some depression.  Taking 1/2 tab po q am of Tranxene and no longer taking second dose.   Has not seen Bradley Ferris, [facility] recently.   Past medication  trials: Sertraline-ineffective, diarrhea Prozac-effective for 20 years and then no longer effective. Effexor XR Anafranil-patient reports that it "made me loopy." BuSpar-helpful Clorazepate-effective Propranolol  Review of Systems:  Review of Systems  Musculoskeletal: Positive for arthralgias. Negative for gait problem.       Reports some pain in thumb joints  Neurological: Negative for tremors.  Psychiatric/Behavioral:       Please refer to HPI    Medications: I have reviewed the patient's current medications.  Current Outpatient Medications  Medication Sig Dispense Refill  . Ascorbic Acid (VITAMIN C) 100 MG tablet Take 100 mg by mouth daily.    Marland Kitchen aspirin EC 81 MG tablet Take 81 mg by mouth 2 (two) times a week.     . busPIRone (BUSPAR) 30 MG tablet Take 1 tablet (30 mg total) by mouth 2 (two) times daily. Take 1 Tablet by mouth Twice daily. 180 tablet 1  . calcium carbonate (OS-CAL - DOSED IN MG OF ELEMENTAL CALCIUM) 1250 (500 Ca) MG tablet Take 1 tablet by mouth.    . clorazepate (TRANXENE) 7.5 MG tablet Take 1/2-1 po BID prn anxiety 30 tablet 2  . estradiol (ESTRACE) 0.5 MG tablet Take 0.5 mg by mouth daily.    . hydrochlorothiazide (HYDRODIURIL) 25 MG tablet Take 25 mg by mouth daily.    Marland Kitchen ibuprofen (ADVIL,MOTRIN) 200 MG tablet Take 200 mg by mouth  every 6 (six) hours as needed.    . Lactobacillus (ACIDOPHILUS PROBIOTIC) 10 MG TABS Take 10 mg by mouth 3 (three) times daily. 30 tablet 0  . Multiple Vitamins-Minerals (HAIR SKIN AND NAILS FORMULA PO) Take by mouth.    . olmesartan (BENICAR) 20 MG tablet   1  . Omega-3 Fatty Acids (FISH OIL) 1000 MG CAPS Take by mouth.    . venlafaxine XR (EFFEXOR XR) 75 MG 24 hr capsule Take 2 capsules po q am and 1 capsule po qhs 270 capsule 0  . Vitamin D, Cholecalciferol, 10 MCG (400 UNIT) CAPS Take by mouth.    . vitamin E 100 UNIT capsule Take by mouth daily.    . propranolol (INDERAL) 10 MG tablet Take 10 mg by mouth 2 (two) times daily.      No current facility-administered medications for this visit.     Medication Side Effects: None  Allergies:  Allergies  Allergen Reactions  . Cephalosporins   . Latex   . Sulfa Antibiotics     Past Medical History:  Diagnosis Date  . Hypertension   . Varicose veins of both lower extremities     Family History  Problem Relation Age of Onset  . Anxiety disorder Mother   . Depression Mother   . Alcohol abuse Father   . Alcohol abuse Maternal Aunt   . Schizophrenia Maternal Uncle   . Anxiety disorder Sister   . Depression Sister   . Alcohol abuse Sister   . Anxiety disorder Sister   . Depression Sister     Social History   Socioeconomic History  . Marital status: Married    Spouse name: Not on file  . Number of children: Not on file  . Years of education: Not on file  . Highest education level: Not on file  Occupational History  . Not on file  Social Needs  . Financial resource strain: Not on file  . Food insecurity:    Worry: Not on file    Inability: Not on file  . Transportation needs:    Medical: Not on file    Non-medical: Not on file  Tobacco Use  . Smoking status: Former Games developermoker  . Smokeless tobacco: Never Used  Substance and Sexual Activity  . Alcohol use: Not on file  . Drug use: Not on file  . Sexual activity: Not on file  Lifestyle  . Physical activity:    Days per week: Not on file    Minutes per session: Not on file  . Stress: Not on file  Relationships  . Social connections:    Talks on phone: Not on file    Gets together: Not on file    Attends religious service: Not on file    Active member of club or organization: Not on file    Attends meetings of clubs or organizations: Not on file    Relationship status: Not on file  . Intimate partner violence:    Fear of current or ex partner: Not on file    Emotionally abused: Not on file    Physically abused: Not on file    Forced sexual activity: Not on file  Other Topics Concern  .  Not on file  Social History Narrative  . Not on file    Past Medical History, Surgical history, Social history, and Family history were reviewed and updated as appropriate.   Please see review of systems for further details on the patient's review from today.  Objective:   Physical Exam:  BP 115/79   Pulse 94   Physical Exam Constitutional:      General: She is not in acute distress.    Appearance: She is well-developed.  Musculoskeletal:        General: No deformity.  Neurological:     Mental Status: She is alert and oriented to person, place, and time.     Coordination: Coordination normal.  Psychiatric:        Attention and Perception: Attention and perception normal. She does not perceive auditory or visual hallucinations.        Mood and Affect: Mood is anxious. Mood is not depressed. Affect is not labile, blunt, angry or inappropriate.        Speech: Speech normal.        Behavior: Behavior normal.        Thought Content: Thought content normal. Thought content does not include homicidal or suicidal ideation. Thought content does not include homicidal or suicidal plan.        Cognition and Memory: Cognition and memory normal.        Judgment: Judgment normal.     Comments: Insight intact. No delusions.      Lab Review:     Component Value Date/Time   NA 134 (L) 04/12/2016 0930   K 3.8 04/12/2016 0930   CL 101 04/12/2016 0930   CO2 26 04/12/2016 0930   GLUCOSE 109 (H) 04/12/2016 0930   BUN 15 04/12/2016 0930   CREATININE 0.69 04/12/2016 0930   CALCIUM 9.0 04/12/2016 0930   PROT 7.4 04/12/2016 0930   ALBUMIN 3.8 04/12/2016 0930   AST 26 04/12/2016 0930   ALT 23 04/12/2016 0930   ALKPHOS 53 04/12/2016 0930   BILITOT 1.1 04/12/2016 0930   GFRNONAA >60 04/12/2016 0930   GFRAA >60 04/12/2016 0930       Component Value Date/Time   WBC 8.0 04/12/2016 0930   RBC 3.87 04/12/2016 0930   HGB 13.2 04/12/2016 0930   HCT 37.8 04/12/2016 0930   PLT 210  04/12/2016 0930   MCV 97.7 04/12/2016 0930   MCH 34.1 (H) 04/12/2016 0930   MCHC 34.9 04/12/2016 0930   RDW 12.0 04/12/2016 0930   LYMPHSABS 1.0 04/12/2016 0930   MONOABS 0.8 04/12/2016 0930   EOSABS 0.1 04/12/2016 0930   BASOSABS 0.0 04/12/2016 0930    No results found for: POCLITH, LITHIUM   No results found for: PHENYTOIN, PHENOBARB, VALPROATE, CBMZ   .res Assessment: Plan:   Discussed that more time is needed to determine response to recent increase in Effexor XR and that itmay be helpful for mood and anxiety since she has had a partial improvement in mood and anxiety s/s at lower doses.  Continue Effexor XR 225 mg po qd for depression and anxiety.  Continue Buspar for anxiety.  Continue Clorazepate prn anxiety.  Recurrent major depressive disorder, in partial remission (HCC) - Plan: venlafaxine XR (EFFEXOR XR) 75 MG 24 hr capsule  Mixed obsessional thoughts and acts - Plan: venlafaxine XR (EFFEXOR XR) 75 MG 24 hr capsule  Moderate episode of recurrent major depressive disorder (HCC)  Please see After Visit Summary for patient specific instructions.  Future Appointments  Date Time Provider Department Center  04/19/2019 10:00 AM Corie Chiquitoarter, [place] Audia, PMHNP CP-CP None    No orders of the defined types were placed in this encounter.     -------------------------------

## 2019-04-19 ENCOUNTER — Other Ambulatory Visit: Payer: Self-pay

## 2019-04-19 ENCOUNTER — Encounter: Payer: Self-pay | Admitting: Psychiatry

## 2019-04-19 ENCOUNTER — Ambulatory Visit (INDEPENDENT_AMBULATORY_CARE_PROVIDER_SITE_OTHER): Payer: BLUE CROSS/BLUE SHIELD | Admitting: Psychiatry

## 2019-04-19 DIAGNOSIS — F902 Attention-deficit hyperactivity disorder, combined type: Secondary | ICD-10-CM

## 2019-04-19 DIAGNOSIS — F422 Mixed obsessional thoughts and acts: Secondary | ICD-10-CM

## 2019-04-19 DIAGNOSIS — F3341 Major depressive disorder, recurrent, in partial remission: Secondary | ICD-10-CM

## 2019-04-19 MED ORDER — VENLAFAXINE HCL ER 75 MG PO CP24: ORAL_CAPSULE | ORAL | 0 refills | Status: DC

## 2019-04-19 MED ORDER — CLORAZEPATE DIPOTASSIUM 7.5 MG PO TABS: ORAL_TABLET | ORAL | 2 refills | Status: DC

## 2019-04-19 MED ORDER — BUSPIRONE HCL 30 MG PO TABS: 30.0000 mg | ORAL_TABLET | Freq: Two times a day (BID) | ORAL | 1 refills | Status: DC

## 2019-04-19 NOTE — Progress Notes (Signed)
Amber Pratt [id number] 1956-06-09 63 y.o.  Virtual Visit via Telephone Note  I connected with@ on 04/19/19 at 10:00 AM EDT by telephone and verified that I am speaking with the correct person using two identifiers.   I discussed the limitations, risks, security and privacy concerns of performing an evaluation and management service by telephone and the availability of in person appointments. I also discussed with the patient that there may be a patient responsible charge related to this service. The patient expressed understanding and agreed to proceed.   I discussed the assessment and treatment plan with the patient. The patient was provided an opportunity to ask questions and all were answered. The patient agreed with the plan and demonstrated an understanding of the instructions.   The patient was advised to call back or seek an in-person evaluation if the symptoms worsen or if the condition fails to improve as anticipated.  I provided 30 minutes of non-face-to-face time during this encounter.  The patient was located at home.  The provider was located at home.   Corie Chiquito, PMHNP   Subjective:   Patient ID:  Amber Pratt is a 63 y.o. (DOB 10/08/1956) female.  Chief Complaint:  Chief Complaint  Patient presents with  . Follow-up    Anxiety, Depression    HPI Amber Pratt presents for follow-up of anxiety and depression. She reports that she has been doing well- "I'm doing fine"- and had thought she would be able to reduce Effexor XR and then decided to continue when pandemic started.   She reports that she has been having some memory/cognitive issues that may be related to Effexor. She reports thinking about something she needs to do, then feeling anxious, and then feeling "foggy." She reports that "it's not all the time." She reports that she has obsessive thoughts, particularly about her health. Reports that she initially has more anxiety about getting COVID 19.   She  reports that she has been walking frequently and thinks this is helpful for her mood and anxiety. Reports that her husband has been having difficulty with pandemic due to his work being affected and not being able to spend time in [place]. She reports that when he is more anxious that she experiences some increased anxiety. She reports having some anxiety about 2 of her children living in areas where COVID incidence is higher, particularly since one son was refusing to wear a mask.   She reports that her mood has been "pretty good." She reports that her mood remains stable if she stays busy. She reports, "I'm happier." She reports that when she is productive she has less time to dwell on negative thoughts. She reports that her energy and motivation have been adequate. She reports that recently her dog has been a source of comfort. She reports that her sleep has been good. She reports that her appetite is "great" and has gained 5 lbs after losing weight with depression. Denies SI.  She reports that she is taking Clorazepate 1/2 tab po q am and 1/2 tab in the afternoon. She notices that she is better able to focus after taking it. Has not been taking Propranolol. Reports that she takes Buspar 30 mg po q am and 15 mg po QHS  Past medication trials: Sertraline-ineffective, diarrhea Prozac-effective for 20 years and then no longer effective. Effexor XR Anafranil-patient reports that it "made me loopy." BuSpar-helpful Clorazepate-effective Propranolol  Review of Systems:  Review of Systems  Musculoskeletal: Negative for gait problem.  Reports that she has been having some shoulder, thumb, and hip pain. Reports that she saw medical provider and labs were WNL. Reports that she is going to have imaging today.  Neurological: Negative for tremors.  Psychiatric/Behavioral:       Please refer to HPI    Medications: I have reviewed the patient's current medications.  Current Outpatient Medications   Medication Sig Dispense Refill  . Ascorbic Acid (VITAMIN C) 100 MG tablet Take 100 mg by mouth daily.    Marland Kitchen. aspirin EC 81 MG tablet Take 81 mg by mouth 2 (two) times a week.     . busPIRone (BUSPAR) 30 MG tablet Take 1 tablet (30 mg total) by mouth 2 (two) times daily. Take 1 Tablet by mouth Twice daily. 180 tablet 1  . calcium carbonate (OS-CAL - DOSED IN MG OF ELEMENTAL CALCIUM) 1250 (500 Ca) MG tablet Take 1 tablet by mouth.    . clorazepate (TRANXENE) 7.5 MG tablet Take 1/2-1 po BID prn anxiety 30 tablet 2  . diclofenac (VOLTAREN) 75 MG EC tablet Take by mouth.    . estradiol (ESTRACE) 0.5 MG tablet Take 0.5 mg by mouth daily.    . hydrochlorothiazide (HYDRODIURIL) 25 MG tablet Take 25 mg by mouth daily.    Marland Kitchen. ibuprofen (ADVIL,MOTRIN) 200 MG tablet Take 200 mg by mouth every 6 (six) hours as needed.    . Lactobacillus (ACIDOPHILUS PROBIOTIC) 10 MG TABS Take 10 mg by mouth 3 (three) times daily. 30 tablet 0  . Multiple Vitamins-Minerals (HAIR SKIN AND NAILS FORMULA PO) Take by mouth.    . olmesartan (BENICAR) 20 MG tablet   1  . Omega-3 Fatty Acids (FISH OIL) 1000 MG CAPS Take by mouth.    . venlafaxine XR (EFFEXOR XR) 75 MG 24 hr capsule Take 2 capsules po q am and 1 capsule po qhs 270 capsule 0  . Vitamin D, Cholecalciferol, 10 MCG (400 UNIT) CAPS Take by mouth.    . vitamin E 100 UNIT capsule Take by mouth daily.    . propranolol (INDERAL) 10 MG tablet Take 10 mg by mouth 2 (two) times daily.     No current facility-administered medications for this visit.     Medication Side Effects: Other: Occ feeling "foggy"  Allergies:  Allergies  Allergen Reactions  . Cephalosporins   . Latex   . Sulfa Antibiotics     Past Medical History:  Diagnosis Date  . Hypertension   . Varicose veins of both lower extremities     Family History  Problem Relation Age of Onset  . Anxiety disorder Mother   . Depression Mother   . Alcohol abuse Father   . Alcohol abuse Maternal Aunt   .  Schizophrenia Maternal Uncle   . Anxiety disorder Sister   . Depression Sister   . Alcohol abuse Sister   . Anxiety disorder Sister   . Depression Sister     Social History   Socioeconomic History  . Marital status: Married    Spouse name: Not on file  . Number of children: Not on file  . Years of education: Not on file  . Highest education level: Not on file  Occupational History  . Not on file  Social Needs  . Financial resource strain: Not on file  . Food insecurity:    Worry: Not on file    Inability: Not on file  . Transportation needs:    Medical: Not on file    Non-medical: Not  on file  Tobacco Use  . Smoking status: Former Games developer  . Smokeless tobacco: Never Used  Substance and Sexual Activity  . Alcohol use: Not on file  . Drug use: Not on file  . Sexual activity: Not on file  Lifestyle  . Physical activity:    Days per week: Not on file    Minutes per session: Not on file  . Stress: Not on file  Relationships  . Social connections:    Talks on phone: Not on file    Gets together: Not on file    Attends religious service: Not on file    Active member of club or organization: Not on file    Attends meetings of clubs or organizations: Not on file    Relationship status: Not on file  . Intimate partner violence:    Fear of current or ex partner: Not on file    Emotionally abused: Not on file    Physically abused: Not on file    Forced sexual activity: Not on file  Other Topics Concern  . Not on file  Social History Narrative  . Not on file    Past Medical History, Surgical history, Social history, and Family history were reviewed and updated as appropriate.   Please see review of systems for further details on the patient's review from today.   Objective:   Physical Exam:  There were no vitals taken for this visit.  Physical Exam Neurological:     Mental Status: She is alert and oriented to person, place, and time.     Cranial Nerves: No  dysarthria.  Psychiatric:        Attention and Perception: Attention normal.        Mood and Affect: Mood is anxious. Mood is not depressed.        Speech: Speech normal.        Behavior: Behavior is cooperative.        Thought Content: Thought content normal. Thought content is not paranoid or delusional. Thought content does not include homicidal or suicidal ideation. Thought content does not include homicidal or suicidal plan.        Cognition and Memory: Cognition and memory normal.        Judgment: Judgment normal.     Lab Review:     Component Value Date/Time   NA 134 (L) 04/12/2016 0930   K 3.8 04/12/2016 0930   CL 101 04/12/2016 0930   CO2 26 04/12/2016 0930   GLUCOSE 109 (H) 04/12/2016 0930   BUN 15 04/12/2016 0930   CREATININE 0.69 04/12/2016 0930   CALCIUM 9.0 04/12/2016 0930   PROT 7.4 04/12/2016 0930   ALBUMIN 3.8 04/12/2016 0930   AST 26 04/12/2016 0930   ALT 23 04/12/2016 0930   ALKPHOS 53 04/12/2016 0930   BILITOT 1.1 04/12/2016 0930   GFRNONAA >60 04/12/2016 0930   GFRAA >60 04/12/2016 0930       Component Value Date/Time   WBC 8.0 04/12/2016 0930   RBC 3.87 04/12/2016 0930   HGB 13.2 04/12/2016 0930   HCT 37.8 04/12/2016 0930   PLT 210 04/12/2016 0930   MCV 97.7 04/12/2016 0930   MCH 34.1 (H) 04/12/2016 0930   MCHC 34.9 04/12/2016 0930   RDW 12.0 04/12/2016 0930   LYMPHSABS 1.0 04/12/2016 0930   MONOABS 0.8 04/12/2016 0930   EOSABS 0.1 04/12/2016 0930   BASOSABS 0.0 04/12/2016 0930    No results found for: POCLITH, LITHIUM  No results found for: PHENYTOIN, PHENOBARB, VALPROATE, CBMZ   .res Assessment: Plan:   Discussed treatment options with patient and also addressed her concerns about occasional forgetfulness and impaired concentration.  Discussed that concerns with memory and concentration seem to be related to anxiety since she reports noticing the symptoms more when her anxiety is higher and reports that she is not having persistent  issues with concentration and memory.  Also answered patient's questions about likelihood of Prozac working for her after it stopped working with taking it for many years.  Discussed that occasionally an SSRI that had previously pooped out will be effective again after a break in treatment, however in other cases it continues to no longer be effective.  Recommended continuing Effexor XR at this time considering current increased stressors and since overall patient has had an improvement in mood and anxiety signs and symptoms.  Discussed considering dose reduction or change in treatment when stressors improve. Continue Effexor XR 225 mg daily for depression and anxiety. Continue BuSpar 30 mg twice daily for anxiety. Continue clorazepate 7.5 mg 1/2 to 1 tablet p.o. twice daily as needed anxiety. Patient to follow-up in 2 months or sooner if clinically indicated.  Mixed obsessional thoughts and acts - Plan: busPIRone (BUSPAR) 30 MG tablet, clorazepate (TRANXENE) 7.5 MG tablet, venlafaxine XR (EFFEXOR XR) 75 MG 24 hr capsule  Recurrent major depressive disorder, in partial remission (HCC) - Plan: venlafaxine XR (EFFEXOR XR) 75 MG 24 hr capsule  Attention deficit hyperactivity disorder (ADHD), combined type  Please see After Visit Summary for patient specific instructions.  No future appointments.  No orders of the defined types were placed in this encounter.     -------------------------------

## 2019-06-27 ENCOUNTER — Ambulatory Visit: Payer: BLUE CROSS/BLUE SHIELD | Admitting: Psychiatry

## 2019-06-27 ENCOUNTER — Encounter

## 2019-07-28 ENCOUNTER — Encounter (INDEPENDENT_AMBULATORY_CARE_PROVIDER_SITE_OTHER): Payer: Self-pay

## 2019-07-28 ENCOUNTER — Other Ambulatory Visit: Payer: Self-pay

## 2019-07-28 ENCOUNTER — Encounter: Payer: Self-pay | Admitting: Psychiatry

## 2019-07-28 ENCOUNTER — Ambulatory Visit (INDEPENDENT_AMBULATORY_CARE_PROVIDER_SITE_OTHER): Payer: BC Managed Care – PPO | Admitting: Psychiatry

## 2019-07-28 VITALS — BP 117/77 | HR 75

## 2019-07-28 DIAGNOSIS — F331 Major depressive disorder, recurrent, moderate: Secondary | ICD-10-CM | POA: Diagnosis not present

## 2019-07-28 DIAGNOSIS — F422 Mixed obsessional thoughts and acts: Secondary | ICD-10-CM

## 2019-07-28 MED ORDER — CLORAZEPATE DIPOTASSIUM 7.5 MG PO TABS: ORAL_TABLET | ORAL | 2 refills | Status: DC

## 2019-07-28 NOTE — Progress Notes (Signed)
Amber Pratt [id number] 1956-01-05 63 y.o.  Subjective:   Patient ID:  Amber Pratt is a 63 y.o. (DOB [birth year]) female.  Chief Complaint:  Chief Complaint  Patient presents with  . Anxiety  . Depression    HPI [place] Vierling presents to the office today for follow-up of anxiety and depression. She reports that her PCP switched her from Effexor XR to paxil. Pt reports, "I'm having issues remembering stuff." She reports that memory has been worse. She reports that memory issues may be anxiety related. Reports that her concentration is impaired and misses details when her husband is talking. Reports that she had difficulty remembering and attaining some information since childhood. Reports that her mother fell almost 7 weeks ago when pt took her to the bank  and fractured her femur and is now in SNF rehab. Pt reports, "I feel so bad" and thinks about what she could have done to prevent her mother falling and feeling guilty for not having eyes on her mother at the time of the fall. Pt reports that the anxiety around her mother's injury is improving as mother recovers. She reports that "Paxil made me feel better when I first started taking it." She reports that she takes 1/2 tab of Clorazepate and this is helpful for her anxiety and concentration. She reports that she is more anxious and overwhelmed when she does not take it. Feels that Paxil is effective for anxiety. She reports that she has had some mild depression- "not bad." Reports that her depression has improved since starting Paxil. Denies any obsessive thoughts unless she is having obsessive thoughts about memory and cognition. Reports that she also has obsessive thoughts that she will not remember to tell her friends something or be able to keep up with conversations. Reports that she is sleeping well. Appetite has been good. Reports that her energy and motivation have been good. Denies SI.   Reports that husband's business has been slower  since the pandemic.   Reports that her mother and sister take Paxil.   Past medication trials: Sertraline-ineffective, diarrhea Prozac-effective for 20 years and then no longer effective. Effexor XR Paxil Anafranil-patient reports that it "made me loopy." BuSpar-helpful Clorazepate-effective Propranolol  Review of Systems:  Review of Systems  Musculoskeletal: Positive for arthralgias. Negative for gait problem.  Neurological: Negative for tremors.  Psychiatric/Behavioral:       Please refer to HPI    Medications: I have reviewed the patient's current medications.  Current Outpatient Medications  Medication Sig Dispense Refill  . Ascorbic Acid (VITAMIN C) 100 MG tablet Take 100 mg by mouth daily.    . busPIRone (BUSPAR) 30 MG tablet Take 1 tablet (30 mg total) by mouth 2 (two) times daily. Take 1 Tablet by mouth Twice daily. 180 tablet 1  . calcium carbonate (OS-CAL - DOSED IN MG OF ELEMENTAL CALCIUM) 1250 (500 Ca) MG tablet Take 1 tablet by mouth.    . clorazepate (TRANXENE) 7.5 MG tablet Take 1/2-1 po BID prn anxiety 30 tablet 2  . estradiol (ESTRACE) 0.5 MG tablet Take 0.5 mg by mouth daily.    Marland Kitchen ibuprofen (ADVIL,MOTRIN) 200 MG tablet Take 200 mg by mouth every 6 (six) hours as needed.    . Lactobacillus (ACIDOPHILUS PROBIOTIC) 10 MG TABS Take 10 mg by mouth 3 (three) times daily. 30 tablet 0  . Multiple Vitamins-Minerals (HAIR SKIN AND NAILS FORMULA PO) Take by mouth.    . olmesartan (BENICAR) 20 MG tablet   1  .  Omega-3 Fatty Acids (FISH OIL) 1000 MG CAPS Take by mouth.    Marland Kitchen. PARoxetine (PAXIL) 20 MG tablet Take 1 tablet daily for 2 weeks then increase to 2 tablets daily    . Vitamin D, Cholecalciferol, 10 MCG (400 UNIT) CAPS Take by mouth.    . vitamin E 100 UNIT capsule Take by mouth daily.    Marland Kitchen. aspirin EC 81 MG tablet Take 81 mg by mouth 2 (two) times a week.     . hydrochlorothiazide (HYDRODIURIL) 25 MG tablet Take 25 mg by mouth daily.    . propranolol (INDERAL) 10 MG  tablet Take 10 mg by mouth 2 (two) times daily.     No current facility-administered medications for this visit.     Medication Side Effects: Other: Questions if Paxil is contributing to memory issues  Allergies:  Allergies  Allergen Reactions  . Cephalosporins   . Latex   . Sulfa Antibiotics     Past Medical History:  Diagnosis Date  . Hypertension   . Varicose veins of both lower extremities     Family History  Problem Relation Age of Onset  . Anxiety disorder Mother   . Depression Mother   . Alcohol abuse Father   . Alcohol abuse Maternal Aunt   . Schizophrenia Maternal Uncle   . Anxiety disorder Sister   . Depression Sister   . Alcohol abuse Sister   . Anxiety disorder Sister   . Depression Sister     Social History   Socioeconomic History  . Marital status: Married    Spouse name: Not on file  . Number of children: Not on file  . Years of education: Not on file  . Highest education level: Not on file  Occupational History  . Not on file  Social Needs  . Financial resource strain: Not on file  . Food insecurity    Worry: Not on file    Inability: Not on file  . Transportation needs    Medical: Not on file    Non-medical: Not on file  Tobacco Use  . Smoking status: Former Games developermoker  . Smokeless tobacco: Never Used  Substance and Sexual Activity  . Alcohol use: Not on file  . Drug use: Not on file  . Sexual activity: Not on file  Lifestyle  . Physical activity    Days per week: Not on file    Minutes per session: Not on file  . Stress: Not on file  Relationships  . Social Musicianconnections    Talks on phone: Not on file    Gets together: Not on file    Attends religious service: Not on file    Active member of club or organization: Not on file    Attends meetings of clubs or organizations: Not on file    Relationship status: Not on file  . Intimate partner violence    Fear of current or ex partner: Not on file    Emotionally abused: Not on file     Physically abused: Not on file    Forced sexual activity: Not on file  Other Topics Concern  . Not on file  Social History Narrative  . Not on file    Past Medical History, Surgical history, Social history, and Family history were reviewed and updated as appropriate.   Please see review of systems for further details on the patient's review from today.   Objective:   Physical Exam:  BP 117/77   Pulse 75  Physical Exam Constitutional:      General: She is not in acute distress.    Appearance: She is well-developed.  Musculoskeletal:        General: No deformity.  Neurological:     Mental Status: She is alert and oriented to person, place, and time.     Coordination: Coordination normal.  Psychiatric:        Attention and Perception: Attention and perception normal. She does not perceive auditory or visual hallucinations.        Mood and Affect: Mood is anxious. Mood is not depressed. Affect is not labile, blunt, angry or inappropriate.        Speech: Speech normal.        Behavior: Behavior normal.        Thought Content: Thought content normal. Thought content is not paranoid or delusional. Thought content does not include homicidal or suicidal ideation. Thought content does not include homicidal or suicidal plan.        Cognition and Memory: Cognition and memory normal.        Judgment: Judgment normal.     Comments: Insight intact Obsessive thoughts about cognition and memory     Lab Review:     Component Value Date/Time   NA 134 (L) 04/12/2016 0930   K 3.8 04/12/2016 0930   CL 101 04/12/2016 0930   CO2 26 04/12/2016 0930   GLUCOSE 109 (H) 04/12/2016 0930   BUN 15 04/12/2016 0930   CREATININE 0.69 04/12/2016 0930   CALCIUM 9.0 04/12/2016 0930   PROT 7.4 04/12/2016 0930   ALBUMIN 3.8 04/12/2016 0930   AST 26 04/12/2016 0930   ALT 23 04/12/2016 0930   ALKPHOS 53 04/12/2016 0930   BILITOT 1.1 04/12/2016 0930   GFRNONAA >60 04/12/2016 0930   GFRAA >60  04/12/2016 0930       Component Value Date/Time   WBC 8.0 04/12/2016 0930   RBC 3.87 04/12/2016 0930   HGB 13.2 04/12/2016 0930   HCT 37.8 04/12/2016 0930   PLT 210 04/12/2016 0930   MCV 97.7 04/12/2016 0930   MCH 34.1 (H) 04/12/2016 0930   MCHC 34.9 04/12/2016 0930   RDW 12.0 04/12/2016 0930   LYMPHSABS 1.0 04/12/2016 0930   MONOABS 0.8 04/12/2016 0930   EOSABS 0.1 04/12/2016 0930   BASOSABS 0.0 04/12/2016 0930    No results found for: POCLITH, LITHIUM   No results found for: PHENYTOIN, PHENOBARB, VALPROATE, CBMZ   .res Assessment: Plan:   Pt seen for 30 minutes and greater than 50% of session spent counseling pt and coordination of care to include reviewing notes from PCP. Discussed that PCP notes one week ago that her cognition had reportedly improved with Paxil and now pt is questioning if Paxil is worsening cognition. Discussed that pt also had similar concerns re: Effexor. Discussed that obsessive thoughts re: possible worsening cognition may be manifesting as compulsions to re-evaluate and change medications frequently due to intrusive thoughts that medications may be worsening cognition. Discussed that frequent medication changes and having multiple providers manage medications for mood and anxiety may interfere with adequate response to treatment and potentially cause negative effects on cognition. Recommended having one provider manage psychiatric medications and that she may chose to have PCP manage psychiatric medications. Pt reports that she would prefer to continue treatment with this provider.   Discussed that more time is likely needed to determine response to increase in Paxil and changes in Clorazepate, and therefore recommend continuing current plan  of care without changes.   Pt to f/u in 4 weeks or sooner if clinically indicated.  Patient advised to contact office with any questions, adverse effects, or acute worsening in signs and symptoms.    Samara DeistKathryn was seen  today for anxiety and depression.  Diagnoses and all orders for this visit:  Mixed obsessional thoughts and acts -     clorazepate (TRANXENE) 7.5 MG tablet; Take 1/2-1 po BID prn anxiety  Moderate episode of recurrent major depressive disorder (HCC)     Please see After Visit Summary for patient specific instructions.  Future Appointments  Date Time Provider Department Center  08/25/2019 10:00 AM Corie Chiquitoarter, Karalyne Nusser, PMHNP [facility] None    No orders of the defined types were placed in this encounter.   -------------------------------

## 2019-08-25 ENCOUNTER — Ambulatory Visit: Payer: BLUE CROSS/BLUE SHIELD | Admitting: Psychiatry

## 2019-09-05 ENCOUNTER — Telehealth: Payer: Self-pay | Admitting: Psychiatry

## 2019-09-05 ENCOUNTER — Other Ambulatory Visit: Payer: Self-pay

## 2019-09-05 MED ORDER — PAROXETINE HCL 20 MG PO TABS: ORAL_TABLET | ORAL | 0 refills | Status: DC

## 2019-09-05 NOTE — Telephone Encounter (Signed)
Patient called and said that she needs a refill on her paxil 20 mg 2xd to be sent to the [facility] in [place].

## 2019-09-22 ENCOUNTER — Ambulatory Visit (INDEPENDENT_AMBULATORY_CARE_PROVIDER_SITE_OTHER): Payer: BC Managed Care – PPO | Admitting: Psychiatry

## 2019-09-22 ENCOUNTER — Other Ambulatory Visit: Payer: Self-pay

## 2019-09-22 ENCOUNTER — Encounter: Payer: Self-pay | Admitting: Psychiatry

## 2019-09-22 DIAGNOSIS — F422 Mixed obsessional thoughts and acts: Secondary | ICD-10-CM | POA: Diagnosis not present

## 2019-09-22 DIAGNOSIS — F331 Major depressive disorder, recurrent, moderate: Secondary | ICD-10-CM

## 2019-09-22 MED ORDER — PAROXETINE HCL 40 MG PO TABS: 60.0000 mg | ORAL_TABLET | ORAL | 2 refills | Status: DC

## 2019-09-22 NOTE — Progress Notes (Signed)
Amber Pratt [id number] [birth year] 63 y.o.  Subjective:   Patient ID:  Amber Pratt is a 63 y.o. (DOB 11-03-1956) female.  Chief Complaint:  Chief Complaint  Patient presents with  . Anxiety  . Depression    HPI Amber Pratt presents to the office today for follow-up of anxiety. She reports some worry r/t pandemic and about the well-being of her children. She reports her son is considering moving back to [place] from [place]. She reports that Paxil seemed to initially be more effective and no longer seems to be doing as well. She reports some mild depression and anxiety. She attributes depression to the pandemic. She reports that she is not having obsessive thoughts currently. Has been trying to get out and walk again. Has also been gardening. She reports that she has been sleeping well. She reports long-standing anxiety since childhood. Appetite has been good. She reports some recent wt loss. She reports that she has been trying to stay busy and remain active. Reports energy and motivation have been good. Concentration is adequate. Denies SI.   Has been meeting weekly with closest friends. She reports that there was a recent sale with she and her husband's developmental properties. Mother has round the clock care at home and is making improvements since her fall.   She reports that she has tended to have depression around the holidays.   She reports that she has been taking Clorazepate 1/2 tab po q am and 1/2 tab po q afternoon. She reports that she has been decreasing Buspar on her own.   Past medication trials: Sertraline-ineffective, diarrhea Prozac-effective for 20 years and then no longer effective. Effexor XR Paxil Anafranil-patient reports that it "made me loopy." BuSpar-helpful Clorazepate-effective Propranolol  Review of Systems:  Review of Systems  Musculoskeletal: Positive for arthralgias. Negative for gait problem.  Neurological: Negative for tremors.   Psychiatric/Behavioral:       Please refer to HPI    Medications: I have reviewed the patient's current medications.  Current Outpatient Medications  Medication Sig Dispense Refill  . Ascorbic Acid (VITAMIN C) 100 MG tablet Take 100 mg by mouth daily.    Marland Kitchen aspirin EC 81 MG tablet Take 81 mg by mouth 2 (two) times a week.     . busPIRone (BUSPAR) 30 MG tablet Take 1 tablet (30 mg total) by mouth 2 (two) times daily. Take 1 Tablet by mouth Twice daily. (Patient taking differently: Take 15 mg by mouth at bedtime. Take 1 Tablet by mouth Twice daily.) 180 tablet 1  . calcium carbonate (OS-CAL - DOSED IN MG OF ELEMENTAL CALCIUM) 1250 (500 Ca) MG tablet Take 1 tablet by mouth.    . clorazepate (TRANXENE) 7.5 MG tablet Take 1/2-1 po BID prn anxiety 30 tablet 2  . estradiol (ESTRACE) 0.5 MG tablet Take 0.5 mg by mouth daily.    . hydrochlorothiazide (HYDRODIURIL) 25 MG tablet Take 25 mg by mouth daily.    Marland Kitchen ibuprofen (ADVIL,MOTRIN) 200 MG tablet Take 200 mg by mouth every 6 (six) hours as needed.    . Lactobacillus (ACIDOPHILUS PROBIOTIC) 10 MG TABS Take 10 mg by mouth 3 (three) times daily. 30 tablet 0  . Multiple Vitamins-Minerals (HAIR SKIN AND NAILS FORMULA PO) Take by mouth.    . olmesartan (BENICAR) 20 MG tablet   1  . Omega-3 Fatty Acids (FISH OIL) 1000 MG CAPS Take by mouth.    . propranolol (INDERAL) 10 MG tablet Take 10 mg by mouth 2 (two) times daily.    Marland Kitchen  Vitamin D, Cholecalciferol, 10 MCG (400 UNIT) CAPS Take by mouth.    . vitamin E 100 UNIT capsule Take by mouth daily.    Marland Kitchen PARoxetine (PAXIL) 40 MG tablet Take 1.5 tablets (60 mg total) by mouth every morning. 45 tablet 2   No current facility-administered medications for this visit.     Medication Side Effects: Other: Notices increased need for reminders and is unsure if this is r/t meds, anxiety, etc  Allergies:  Allergies  Allergen Reactions  . Cephalosporins   . Latex   . Sulfa Antibiotics     Past Medical History:   Diagnosis Date  . Hypertension   . Varicose veins of both lower extremities     Family History  Problem Relation Age of Onset  . Anxiety disorder Mother   . Depression Mother   . Alcohol abuse Father   . Alcohol abuse Maternal Aunt   . Schizophrenia Maternal Uncle   . Anxiety disorder Sister   . Depression Sister   . Alcohol abuse Sister   . Anxiety disorder Sister   . Depression Sister     Social History   Socioeconomic History  . Marital status: Married    Spouse name: Not on file  . Number of children: Not on file  . Years of education: Not on file  . Highest education level: Not on file  Occupational History  . Not on file  Social Needs  . Financial resource strain: Not on file  . Food insecurity    Worry: Not on file    Inability: Not on file  . Transportation needs    Medical: Not on file    Non-medical: Not on file  Tobacco Use  . Smoking status: Former Games developer  . Smokeless tobacco: Never Used  Substance and Sexual Activity  . Alcohol use: Not on file  . Drug use: Not on file  . Sexual activity: Not on file  Lifestyle  . Physical activity    Days per week: Not on file    Minutes per session: Not on file  . Stress: Not on file  Relationships  . Social Musician on phone: Not on file    Gets together: Not on file    Attends religious service: Not on file    Active member of club or organization: Not on file    Attends meetings of clubs or organizations: Not on file    Relationship status: Not on file  . Intimate partner violence    Fear of current or ex partner: Not on file    Emotionally abused: Not on file    Physically abused: Not on file    Forced sexual activity: Not on file  Other Topics Concern  . Not on file  Social History Narrative  . Not on file    Past Medical History, Surgical history, Social history, and Family history were reviewed and updated as appropriate.   Please see review of systems for further details on the  patient's review from today.   Objective:   Physical Exam:  There were no vitals taken for this visit.  Physical Exam Constitutional:      General: She is not in acute distress.    Appearance: She is well-developed.  Musculoskeletal:        General: No deformity.  Neurological:     Mental Status: She is alert and oriented to person, place, and time.     Coordination: Coordination normal.  Psychiatric:  Attention and Perception: Attention and perception normal. She does not perceive auditory or visual hallucinations.        Mood and Affect: Mood is anxious. Affect is not labile, blunt, angry or inappropriate.        Speech: Speech normal.        Behavior: Behavior normal.        Thought Content: Thought content normal. Thought content is not paranoid or delusional. Thought content does not include homicidal or suicidal ideation. Thought content does not include homicidal or suicidal plan.        Cognition and Memory: Cognition and memory normal.        Judgment: Judgment normal.     Comments: Mood presents as mildly depressed Insight intact. No delusions.      Lab Review:     Component Value Date/Time   NA 134 (L) 04/12/2016 0930   K 3.8 04/12/2016 0930   CL 101 04/12/2016 0930   CO2 26 04/12/2016 0930   GLUCOSE 109 (H) 04/12/2016 0930   BUN 15 04/12/2016 0930   CREATININE 0.69 04/12/2016 0930   CALCIUM 9.0 04/12/2016 0930   PROT 7.4 04/12/2016 0930   ALBUMIN 3.8 04/12/2016 0930   AST 26 04/12/2016 0930   ALT 23 04/12/2016 0930   ALKPHOS 53 04/12/2016 0930   BILITOT 1.1 04/12/2016 0930   GFRNONAA >60 04/12/2016 0930   GFRAA >60 04/12/2016 0930       Component Value Date/Time   WBC 8.0 04/12/2016 0930   RBC 3.87 04/12/2016 0930   HGB 13.2 04/12/2016 0930   HCT 37.8 04/12/2016 0930   PLT 210 04/12/2016 0930   MCV 97.7 04/12/2016 0930   MCH 34.1 (H) 04/12/2016 0930   MCHC 34.9 04/12/2016 0930   RDW 12.0 04/12/2016 0930   LYMPHSABS 1.0 04/12/2016 0930    MONOABS 0.8 04/12/2016 0930   EOSABS 0.1 04/12/2016 0930   BASOSABS 0.0 04/12/2016 0930    No results found for: POCLITH, LITHIUM   No results found for: PHENYTOIN, PHENOBARB, VALPROATE, CBMZ   .res Assessment: Plan:   Patient seen for 30 minutes and greater than 50% of visit spent counseling patient regarding potential benefits, risks, and side effects of increasing Paxil to 60 mg daily.  Discussed that Paxil is indicated for both depression and anxiety and increase in dose may help improve both mood and anxiety signs and symptoms. Answered patient's questions regarding long-term use of clorazepate and discussed that she is currently taking a low dose and has noticed benefit at the same dose without needing to increase dosage and has tolerated it well. Discussed patient's response to buspirone and patient reports that she would like to continue to take buspirone at bedtime since she feels that it is helpful for her sleep.  Will continue BuSpar 15 mg at bedtime at this time and will continue to reevaluate.  Discussed that BuSpar is typically not used for sleep but can occasionally cause some drowsiness.  Discussed that she is likely not experiencing full benefit of BuSpar in terms of anxiety with taking at at bedtime only. Patient to follow-up in 2 months or sooner if clinically indicated. Patient advised to contact office with any questions, adverse effects, or acute worsening in signs and symptoms.  Katerine was seen today for anxiety and depression.  Diagnoses and all orders for this visit:  Mixed obsessional thoughts and acts -     PARoxetine (PAXIL) 40 MG tablet; Take 1.5 tablets (60 mg total) by mouth every  morning.  Moderate episode of recurrent major depressive disorder (HCC) -     PARoxetine (PAXIL) 40 MG tablet; Take 1.5 tablets (60 mg total) by mouth every morning.     Please see After Visit Summary for patient specific instructions.  Future Appointments  Date Time Provider  Department Center  11/22/2019 10:00 AM Corie Chiquitoarter, Shuntae Herzig, PMHNP [facility] None    No orders of the defined types were placed in this encounter.   -------------------------------

## 2019-11-22 ENCOUNTER — Encounter: Payer: Self-pay | Admitting: Psychiatry

## 2019-11-22 ENCOUNTER — Ambulatory Visit (INDEPENDENT_AMBULATORY_CARE_PROVIDER_SITE_OTHER): Payer: BC Managed Care – PPO | Admitting: Psychiatry

## 2019-11-22 ENCOUNTER — Other Ambulatory Visit: Payer: Self-pay

## 2019-11-22 DIAGNOSIS — F422 Mixed obsessional thoughts and acts: Secondary | ICD-10-CM | POA: Diagnosis not present

## 2019-11-22 DIAGNOSIS — F331 Major depressive disorder, recurrent, moderate: Secondary | ICD-10-CM | POA: Diagnosis not present

## 2019-11-22 MED ORDER — PAROXETINE HCL 40 MG PO TABS: 60.0000 mg | ORAL_TABLET | ORAL | 2 refills | Status: DC

## 2019-11-22 NOTE — Progress Notes (Signed)
Amber Pratt [id number] 1956/11/04 63 y.o.  Subjective:   Patient ID:  Amber Pratt is a 63 y.o. (DOB [birth year]) female.  Chief Complaint:  Chief Complaint  Patient presents with  . Follow-up    Anxiety, Depression    HPI Amber Pratt presents to the office today for follow-up of anxiety. She reports that increase in Paxil to 60 mg po qd has been helpful. She reports that Clorazepate is helpful for her anxiety that is typically highest in the morning. Reports that she has been taking 1/2 tab of Buspar once daily.   She reports some worry about her children. Her 3 children were in for Thanksgiving and they all brought large dogs and this triggered her anxiety. She reports anxiety with situation where family is resisting moving out of a house that she and her husband would like to sell. She reports obsessive thoughts have been "ok."   Reports that the holidays have always been a difficult time for her. She reports "I'm a little bit depressed." She reports that her energy and motivation have been good and has been trying to stay busy to help occupy her thoughts a manage anxiety. Has been trying to walk regularly. Chronic difficulty with concentration. Denies SI.   Has not been interested in doing art work recently. Gathers with old friends weekly.   Past medication trials: Sertraline-ineffective, diarrhea Prozac-effective for 20 years and then no longer effective. Effexor XR Paxil Anafranil-patient reports that it "made me loopy." BuSpar-helpful Clorazepate-effective Propranolol   Review of Systems:  Review of Systems  Musculoskeletal: Negative for gait problem.       Shoulder pain. Occ hip pain  Neurological: Positive for headaches.  Psychiatric/Behavioral:       Please refer to HPI    Medications: I have reviewed the patient's current medications.  Current Outpatient Medications  Medication Sig Dispense Refill  . Ascorbic Acid (VITAMIN C) 100 MG tablet Take 100 mg  by mouth daily.    Marland Kitchen aspirin EC 81 MG tablet Take 81 mg by mouth 2 (two) times a week.     . busPIRone (BUSPAR) 30 MG tablet Take 1 tablet (30 mg total) by mouth 2 (two) times daily. Take 1 Tablet by mouth Twice daily. (Patient taking differently: Take 15 mg by mouth at bedtime. Take 1 Tablet by mouth Twice daily.) 180 tablet 1  . calcium carbonate (OS-CAL - DOSED IN MG OF ELEMENTAL CALCIUM) 1250 (500 Ca) MG tablet Take 1 tablet by mouth.    . clorazepate (TRANXENE) 7.5 MG tablet Take 1/2-1 po BID prn anxiety 30 tablet 2  . estradiol (ESTRACE) 0.5 MG tablet Take 0.5 mg by mouth daily.    . hydrochlorothiazide (HYDRODIURIL) 25 MG tablet Take 25 mg by mouth daily.    Marland Kitchen ibuprofen (ADVIL,MOTRIN) 200 MG tablet Take 200 mg by mouth every 6 (six) hours as needed.    . Lactobacillus (ACIDOPHILUS PROBIOTIC) 10 MG TABS Take 10 mg by mouth 3 (three) times daily. 30 tablet 0  . Multiple Vitamins-Minerals (HAIR SKIN AND NAILS FORMULA PO) Take by mouth.    . olmesartan (BENICAR) 20 MG tablet   1  . Omega-3 Fatty Acids (FISH OIL) 1000 MG CAPS Take by mouth.    Marland Kitchen PARoxetine (PAXIL) 40 MG tablet Take 1.5 tablets (60 mg total) by mouth every morning. 45 tablet 2  . propranolol (INDERAL) 10 MG tablet Take 10 mg by mouth 2 (two) times daily.    . Vitamin D, Cholecalciferol, 10 MCG (400  UNIT) CAPS Take by mouth.    . vitamin E 100 UNIT capsule Take by mouth daily.     No current facility-administered medications for this visit.     Medication Side Effects: None  Allergies:  Allergies  Allergen Reactions  . Cephalosporins   . Latex   . Sulfa Antibiotics     Past Medical History:  Diagnosis Date  . Hypertension   . Varicose veins of both lower extremities     Family History  Problem Relation Age of Onset  . Anxiety disorder Mother   . Depression Mother   . Alcohol abuse Father   . Alcohol abuse Maternal Aunt   . Schizophrenia Maternal Uncle   . Anxiety disorder Sister   . Depression Sister   .  Alcohol abuse Sister   . Anxiety disorder Sister   . Depression Sister     Social History   Socioeconomic History  . Marital status: Married    Spouse name: Not on file  . Number of children: Not on file  . Years of education: Not on file  . Highest education level: Not on file  Occupational History  . Not on file  Social Needs  . Financial resource strain: Not on file  . Food insecurity    Worry: Not on file    Inability: Not on file  . Transportation needs    Medical: Not on file    Non-medical: Not on file  Tobacco Use  . Smoking status: Former Games developermoker  . Smokeless tobacco: Never Used  Substance and Sexual Activity  . Alcohol use: Not on file  . Drug use: Not on file  . Sexual activity: Not on file  Lifestyle  . Physical activity    Days per week: Not on file    Minutes per session: Not on file  . Stress: Not on file  Relationships  . Social Musicianconnections    Talks on phone: Not on file    Gets together: Not on file    Attends religious service: Not on file    Active member of club or organization: Not on file    Attends meetings of clubs or organizations: Not on file    Relationship status: Not on file  . Intimate partner violence    Fear of current or ex partner: Not on file    Emotionally abused: Not on file    Physically abused: Not on file    Forced sexual activity: Not on file  Other Topics Concern  . Not on file  Social History Narrative  . Not on file    Past Medical History, Surgical history, Social history, and Family history were reviewed and updated as appropriate.   Please see review of systems for further details on the patient's review from today.   Objective:   Physical Exam:  There were no vitals taken for this visit.  Physical Exam Constitutional:      General: She is not in acute distress.    Appearance: She is well-developed.  Musculoskeletal:        General: No deformity.  Neurological:     Mental Status: She is alert and  oriented to person, place, and time.     Coordination: Coordination normal.  Psychiatric:        Attention and Perception: Attention and perception normal. She does not perceive auditory or visual hallucinations.        Mood and Affect: Affect is not labile, blunt, angry or inappropriate.  Speech: Speech normal.        Behavior: Behavior normal.        Thought Content: Thought content normal. Thought content is not paranoid or delusional. Thought content does not include homicidal or suicidal ideation. Thought content does not include homicidal or suicidal plan.        Cognition and Memory: Cognition and memory normal.        Judgment: Judgment normal.     Comments: Mood presents as less anxious compared to last exam. Mildly depressed.  Insight intact. No delusions.      Lab Review:     Component Value Date/Time   NA 134 (L) 04/12/2016 0930   K 3.8 04/12/2016 0930   CL 101 04/12/2016 0930   CO2 26 04/12/2016 0930   GLUCOSE 109 (H) 04/12/2016 0930   BUN 15 04/12/2016 0930   CREATININE 0.69 04/12/2016 0930   CALCIUM 9.0 04/12/2016 0930   PROT 7.4 04/12/2016 0930   ALBUMIN 3.8 04/12/2016 0930   AST 26 04/12/2016 0930   ALT 23 04/12/2016 0930   ALKPHOS 53 04/12/2016 0930   BILITOT 1.1 04/12/2016 0930   GFRNONAA >60 04/12/2016 0930   GFRAA >60 04/12/2016 0930       Component Value Date/Time   WBC 8.0 04/12/2016 0930   RBC 3.87 04/12/2016 0930   HGB 13.2 04/12/2016 0930   HCT 37.8 04/12/2016 0930   PLT 210 04/12/2016 0930   MCV 97.7 04/12/2016 0930   MCH 34.1 (H) 04/12/2016 0930   MCHC 34.9 04/12/2016 0930   RDW 12.0 04/12/2016 0930   LYMPHSABS 1.0 04/12/2016 0930   MONOABS 0.8 04/12/2016 0930   EOSABS 0.1 04/12/2016 0930   BASOSABS 0.0 04/12/2016 0930    No results found for: POCLITH, LITHIUM   No results found for: PHENYTOIN, PHENOBARB, VALPROATE, CBMZ   .res Assessment: Plan:   Pt seen for 30 minutes and greater than 50% of session spent counseling pt  re: long-term tx plan and strategies to help manage anxiety. Discussed taking Buspar 1/2 tab po BID instead of 1 tab po qd due to short half-life of Buspar. Agreed that there has been some benefit with increase in Paxil to 60 mg po qd and recommend continuing Paxil 60 mg po qd through the holidays since this is usually the most difficult time of the year for her.  Continue Clorazepate for anxiety.  Pt to f/u in 3-4 months or sooner if clinically indicated.  Patient advised to contact office with any questions, adverse effects, or acute worsening in signs and symptoms.  Rudi was seen today for follow-up.  Diagnoses and all orders for this visit:  Mixed obsessional thoughts and acts -     PARoxetine (PAXIL) 40 MG tablet; Take 1.5 tablets (60 mg total) by mouth every morning.  Moderate episode of recurrent major depressive disorder (HCC) -     PARoxetine (PAXIL) 40 MG tablet; Take 1.5 tablets (60 mg total) by mouth every morning.     Please see After Visit Summary for patient specific instructions.  No future appointments.  No orders of the defined types were placed in this encounter.   -------------------------------

## 2020-03-12 ENCOUNTER — Ambulatory Visit (INDEPENDENT_AMBULATORY_CARE_PROVIDER_SITE_OTHER): Payer: BC Managed Care – PPO | Admitting: Psychiatry

## 2020-03-12 ENCOUNTER — Other Ambulatory Visit: Payer: Self-pay

## 2020-03-12 ENCOUNTER — Encounter: Payer: Self-pay | Admitting: Psychiatry

## 2020-03-12 DIAGNOSIS — F331 Major depressive disorder, recurrent, moderate: Secondary | ICD-10-CM | POA: Diagnosis not present

## 2020-03-12 DIAGNOSIS — F422 Mixed obsessional thoughts and acts: Secondary | ICD-10-CM

## 2020-03-12 MED ORDER — BUSPIRONE HCL 30 MG PO TABS: 30.0000 mg | ORAL_TABLET | Freq: Two times a day (BID) | ORAL | 1 refills | Status: DC

## 2020-03-12 MED ORDER — CLORAZEPATE DIPOTASSIUM 7.5 MG PO TABS: ORAL_TABLET | ORAL | 2 refills | Status: DC

## 2020-03-12 MED ORDER — PAROXETINE HCL 40 MG PO TABS: ORAL_TABLET | ORAL | 2 refills | Status: DC

## 2020-03-12 MED ORDER — FLUOXETINE HCL 20 MG PO CAPS: ORAL_CAPSULE | ORAL | 1 refills | Status: DC

## 2020-03-12 NOTE — Progress Notes (Signed)
Amber Pratt [id number] 04-29-1956 64 y.o.  Subjective:   Patient ID:  Amber Pratt is a 64 y.o. (DOB 1956-05-12) female.  Chief Complaint:  Chief Complaint  Patient presents with  . Follow-up    h/o anxiety and depression    HPI [name] presents to the office today for follow-up of anxiety and depression. "I've been struggling." She reports that she has been experiencing depression. Reports that her thinking has been "fuzzy." Her anxiety has been "ok." Has had some worry in response to the pandemic. Anxiety is the worst in the morning and feels "agitated" upon awakening until Clorazepate becomes effective. Denies any recent intrusive thoughts. She reports that she feels socially isolated. Energy and motivation have been low. Interests have been low. Sleeping well. Appetite has been ok. Not eating as much as she used to. Has been losing weight. Currently weighing 126 lbs and usually weighs 135 lbs-140 lbs. Self-image has been lower. Denies SI.   Taking 1/2 of a Clorazepate in the morning and again in the afternoon.   She reports that she received her first injection and is scheduled for 2nd injection next week.   Mother is 29 yo and has dementia. Goes to see mother regularly. She continues to see group of friends weekly for dinner. Concerned that she has not met any new friends in [place] after being there 2 years.   Past medication trials: Sertraline-ineffective, diarrhea Prozac-effective for 20 years and then no longer effective. Helped with both anxiety and depression. Has not taken for years,  Effexor XR Paxil Anafranil-patient reports that it "made me loopy." BuSpar-helpful Clorazepate-effective Propranolol   Review of Systems:  Review of Systems  Gastrointestinal: Positive for abdominal pain.  Musculoskeletal: Negative for gait problem.  Neurological: Negative for tremors.  Psychiatric/Behavioral:       Please refer to HPI    Medications: I have reviewed  the patient's current medications.  Current Outpatient Medications  Medication Sig Dispense Refill  . Ascorbic Acid (VITAMIN C) 100 MG tablet Take 100 mg by mouth daily.    Marland Kitchen aspirin EC 81 MG tablet Take 81 mg by mouth 2 (two) times a week.     . busPIRone (BUSPAR) 30 MG tablet Take 1 tablet (30 mg total) by mouth 2 (two) times daily. Take 1 Tablet by mouth Twice daily. 180 tablet 1  . calcium carbonate (OS-CAL - DOSED IN MG OF ELEMENTAL CALCIUM) 1250 (500 Ca) MG tablet Take 1 tablet by mouth.    . clorazepate (TRANXENE) 7.5 MG tablet Take 1/2-1 po BID prn anxiety 30 tablet 2  . estradiol (ESTRACE) 0.5 MG tablet Take 0.5 mg by mouth daily.    . hydrochlorothiazide (HYDRODIURIL) 25 MG tablet Take 25 mg by mouth daily.    Marland Kitchen ibuprofen (ADVIL,MOTRIN) 200 MG tablet Take 200 mg by mouth every 6 (six) hours as needed.    . Lactobacillus (ACIDOPHILUS PROBIOTIC) 10 MG TABS Take 10 mg by mouth 3 (three) times daily. 30 tablet 0  . Multiple Vitamins-Minerals (HAIR SKIN AND NAILS FORMULA PO) Take by mouth.    . olmesartan (BENICAR) 20 MG tablet   1  . Omega-3 Fatty Acids (FISH OIL) 1000 MG CAPS Take by mouth.    Marland Kitchen PARoxetine (PAXIL) 40 MG tablet Take 1 tab daily for one week, then take 1/2 tab daily for one week, then stop 45 tablet 2  . propranolol (INDERAL) 10 MG tablet Take 10 mg by mouth 2 (two) times daily.    . Vitamin  D, Cholecalciferol, 10 MCG (400 UNIT) CAPS Take by mouth.    . vitamin E 100 UNIT capsule Take by mouth daily.    Marland Kitchen FLUoxetine (PROZAC) 20 MG capsule Take 1 capsule daily for one week, then increase to 2 capsules daily 60 capsule 1   No current facility-administered medications for this visit.    Medication Side Effects: None  Allergies:  Allergies  Allergen Reactions  . Cephalosporins   . Latex   . Sulfa Antibiotics     Past Medical History:  Diagnosis Date  . Hypertension   . Varicose veins of both lower extremities     Family History  Problem Relation Age of  Onset  . Anxiety disorder Mother   . Depression Mother   . Alcohol abuse Father   . Alcohol abuse Maternal Aunt   . Schizophrenia Maternal Uncle   . Anxiety disorder Sister   . Depression Sister   . Alcohol abuse Sister   . Anxiety disorder Sister   . Depression Sister     Social History   Socioeconomic History  . Marital status: Married    Spouse name: Not on file  . Number of children: Not on file  . Years of education: Not on file  . Highest education level: Not on file  Occupational History  . Not on file  Tobacco Use  . Smoking status: Former Research scientist (life sciences)  . Smokeless tobacco: Never Used  Substance and Sexual Activity  . Alcohol use: Not on file  . Drug use: Not on file  . Sexual activity: Not on file  Other Topics Concern  . Not on file  Social History Narrative  . Not on file   Social Determinants of Health   Financial Resource Strain:   . Difficulty of Paying Living Expenses:   Food Insecurity:   . Worried About Charity fundraiser in the Last Year:   . Arboriculturist in the Last Year:   Transportation Needs:   . Film/video editor (Medical):   Marland Kitchen Lack of Transportation (Non-Medical):   Physical Activity:   . Days of Exercise per Week:   . Minutes of Exercise per Session:   Stress:   . Feeling of Stress :   Social Connections:   . Frequency of Communication with Friends and Family:   . Frequency of Social Gatherings with Friends and Family:   . Attends Religious Services:   . Active Member of Clubs or Organizations:   . Attends Archivist Meetings:   Marland Kitchen Marital Status:   Intimate Partner Violence:   . Fear of Current or Ex-Partner:   . Emotionally Abused:   Marland Kitchen Physically Abused:   . Sexually Abused:     Past Medical History, Surgical history, Social history, and Family history were reviewed and updated as appropriate.   Please see review of systems for further details on the patient's review from today.   Objective:   Physical Exam:   Wt 126 lb (57.2 kg)   Physical Exam Constitutional:      General: She is not in acute distress. Musculoskeletal:        General: No deformity.  Neurological:     Mental Status: She is alert and oriented to person, place, and time.     Coordination: Coordination normal.  Psychiatric:        Attention and Perception: Attention and perception normal. She does not perceive auditory or visual hallucinations.        Mood  and Affect: Mood is anxious and depressed. Affect is not labile, blunt, angry or inappropriate.        Speech: Speech normal.        Behavior: Behavior normal.        Thought Content: Thought content normal. Thought content is not paranoid or delusional. Thought content does not include homicidal or suicidal ideation. Thought content does not include homicidal or suicidal plan.        Cognition and Memory: Cognition and memory normal.        Judgment: Judgment normal.     Comments: Insight intact     Lab Review:     Component Value Date/Time   NA 134 (L) 04/12/2016 0930   K 3.8 04/12/2016 0930   CL 101 04/12/2016 0930   CO2 26 04/12/2016 0930   GLUCOSE 109 (H) 04/12/2016 0930   BUN 15 04/12/2016 0930   CREATININE 0.69 04/12/2016 0930   CALCIUM 9.0 04/12/2016 0930   PROT 7.4 04/12/2016 0930   ALBUMIN 3.8 04/12/2016 0930   AST 26 04/12/2016 0930   ALT 23 04/12/2016 0930   ALKPHOS 53 04/12/2016 0930   BILITOT 1.1 04/12/2016 0930   GFRNONAA >60 04/12/2016 0930   GFRAA >60 04/12/2016 0930       Component Value Date/Time   WBC 8.0 04/12/2016 0930   RBC 3.87 04/12/2016 0930   HGB 13.2 04/12/2016 0930   HCT 37.8 04/12/2016 0930   PLT 210 04/12/2016 0930   MCV 97.7 04/12/2016 0930   MCH 34.1 (H) 04/12/2016 0930   MCHC 34.9 04/12/2016 0930   RDW 12.0 04/12/2016 0930   LYMPHSABS 1.0 04/12/2016 0930   MONOABS 0.8 04/12/2016 0930   EOSABS 0.1 04/12/2016 0930   BASOSABS 0.0 04/12/2016 0930    No results found for: POCLITH, LITHIUM   No results found for:  PHENYTOIN, PHENOBARB, VALPROATE, CBMZ   .res Assessment: Plan:   Case staffed with Dr. Clovis Pu.  Will change Paxil to Prozac since Paxil has been ineffective for her depressive s/s and only partially effective for her anxiety s/s. Will switch back to Prozac since this medication worked very well for her in the past. Will start Prozac 20 mg po qd for 1 week, then increase to 40 mg po qd for depression and anxiety.  Will decrease Paxil gradually to minimize risk of discontinuation s/s. Will decrease Paxil to 40 mg po qd for one week, then decrease to 20 mg po qd for one week, then stop.  Pt advised to start Prozac while starting Paxil to minimize risk of discontinuation s/s.  Continue Buspar 30 mg po BID for anxiety. Continue Clorazepate prn anxiety.  Pt to f/u in 5 weeks or sooner if clinically indicated.  Patient advised to contact office with any questions, adverse effects, or acute worsening in signs and symptoms.   Clinton was seen today for follow-up.  Diagnoses and all orders for this visit:  Mixed obsessional thoughts and acts -     PARoxetine (PAXIL) 40 MG tablet; Take 1 tab daily for one week, then take 1/2 tab daily for one week, then stop -     FLUoxetine (PROZAC) 20 MG capsule; Take 1 capsule daily for one week, then increase to 2 capsules daily -     clorazepate (TRANXENE) 7.5 MG tablet; Take 1/2-1 po BID prn anxiety -     busPIRone (BUSPAR) 30 MG tablet; Take 1 tablet (30 mg total) by mouth 2 (two) times daily. Take 1 Tablet by  mouth Twice daily.  Moderate episode of recurrent major depressive disorder (HCC) -     PARoxetine (PAXIL) 40 MG tablet; Take 1 tab daily for one week, then take 1/2 tab daily for one week, then stop -     FLUoxetine (PROZAC) 20 MG capsule; Take 1 capsule daily for one week, then increase to 2 capsules daily     Please see After Visit Summary for patient specific instructions.  Future Appointments  Date Time Provider [place]  04/16/2020  10:30 AM Thayer Headings, PMHNP [facility] None    No orders of the defined types were placed in this encounter.   -------------------------------

## 2020-03-12 NOTE — Patient Instructions (Signed)
Take 1 Paxil 40 mg tab daily for one week, then take 1/2 tab daily for one week, then stop.  Start Prozac (Fluoxetine) 1 20 mg capsule daily for one week (while taking Paxil 40 mg), then increase to 2 capsules daily.

## 2020-04-16 ENCOUNTER — Other Ambulatory Visit: Payer: Self-pay

## 2020-04-16 ENCOUNTER — Encounter: Payer: Self-pay | Admitting: Psychiatry

## 2020-04-16 ENCOUNTER — Ambulatory Visit (INDEPENDENT_AMBULATORY_CARE_PROVIDER_SITE_OTHER): Payer: BC Managed Care – PPO | Admitting: Psychiatry

## 2020-04-16 DIAGNOSIS — F422 Mixed obsessional thoughts and acts: Secondary | ICD-10-CM

## 2020-04-16 DIAGNOSIS — F331 Major depressive disorder, recurrent, moderate: Secondary | ICD-10-CM | POA: Diagnosis not present

## 2020-04-16 MED ORDER — FLUOXETINE HCL 20 MG PO CAPS: 60.0000 mg | ORAL_CAPSULE | Freq: Every day | ORAL | 0 refills | Status: DC

## 2020-04-16 NOTE — Progress Notes (Signed)
[name] [id number] 1956/08/06 64 y.o.  Subjective:   Patient ID:  Amber Pratt is a 64 y.o. (DOB [birth year]) female.  Chief Complaint:  Chief Complaint  Patient presents with  . Depression  . Anxiety    HPI Amber Pratt presents to the office today for follow-up of anxiety and depression. She reports that she has been having increased anxiety since the start of the pandemic. She reports that she has been "really depressed." She reports that depression has been worse than it has been in awhile. She reports that anxiety "is there." She reports that she feels ok upon awakening and then feels the depression worsen in the morning. She reports that she tries to get up and get going in the morning to help with depression. Continues to have worry and anxious thoughts. Enjoys her dog. Energy and motivation have been lower. She reports that she has been having to make herself eat. Has been sleeping well. She reports that concentration is adequate and able to read at night. Denies SI.   She reports some worry about her middle son who is 32 yo, after noticing some changes in his thinking and behavior. Reports that it has been difficult to make friends during the pandemic.   She reports that she was able to tolerate cross-titration from Paxil to Prozac without difficulty.   May have forgotten to take Estradiol  Reports that she has been taking Clorazepate 1/2 tab po BID.   Past medication trials: Sertraline-ineffective, diarrhea Prozac-effective for 20 years and then no longer effective. Helped with both anxiety and depression. Has not taken for years,  Effexor XR Paxil Anafranil-patient reports that it "made me loopy." BuSpar-helpful Clorazepate-effective Propranolol     Review of Systems:  Review of Systems  Gastrointestinal: Positive for diarrhea.       Loose stools  Musculoskeletal: Negative for gait problem.  Neurological: Positive for tremors.       Occ hand tremor   Psychiatric/Behavioral:       Please refer to HPI    Medications: I have reviewed the patient's current medications.  Current Outpatient Medications  Medication Sig Dispense Refill  . Ascorbic Acid (VITAMIN C) 100 MG tablet Take 100 mg by mouth daily.    Marland Kitchen aspirin EC 81 MG tablet Take 81 mg by mouth 2 (two) times a week.     . busPIRone (BUSPAR) 30 MG tablet Take 1 tablet (30 mg total) by mouth 2 (two) times daily. Take 1 Tablet by mouth Twice daily. 180 tablet 1  . calcium carbonate (OS-CAL - DOSED IN MG OF ELEMENTAL CALCIUM) 1250 (500 Ca) MG tablet Take 1 tablet by mouth.    . clorazepate (TRANXENE) 7.5 MG tablet Take 1/2-1 po BID prn anxiety 30 tablet 2  . FLUoxetine (PROZAC) 20 MG capsule Take 3 capsules (60 mg total) by mouth daily. 90 capsule 0  . hydrochlorothiazide (HYDRODIURIL) 25 MG tablet Take 25 mg by mouth daily.    Marland Kitchen ibuprofen (ADVIL,MOTRIN) 200 MG tablet Take 200 mg by mouth every 6 (six) hours as needed.    . Lactobacillus (ACIDOPHILUS PROBIOTIC) 10 MG TABS Take 10 mg by mouth 3 (three) times daily. 30 tablet 0  . Multiple Vitamins-Minerals (HAIR SKIN AND NAILS FORMULA PO) Take by mouth.    . olmesartan (BENICAR) 20 MG tablet   1  . Omega-3 Fatty Acids (FISH OIL) 1000 MG CAPS Take by mouth.    . propranolol (INDERAL) 10 MG tablet Take 10 mg by mouth 2 (  two) times daily.    . Vitamin D, Cholecalciferol, 10 MCG (400 UNIT) CAPS Take by mouth.    . vitamin E 100 UNIT capsule Take by mouth daily.    Marland Kitchen estradiol (ESTRACE) 0.5 MG tablet Take 0.5 mg by mouth daily.     No current facility-administered medications for this visit.    Medication Side Effects: None  Allergies:  Allergies  Allergen Reactions  . Cephalosporins   . Latex   . Sulfa Antibiotics     Past Medical History:  Diagnosis Date  . Hypertension   . Varicose veins of both lower extremities     Family History  Problem Relation Age of Onset  . Anxiety disorder Mother   . Depression Mother   .  Alcohol abuse Father   . Alcohol abuse Maternal Aunt   . Schizophrenia Maternal Uncle   . Anxiety disorder Sister   . Depression Sister   . Alcohol abuse Sister   . Anxiety disorder Sister   . Depression Sister     Social History   Socioeconomic History  . Marital status: Married    Spouse name: Not on file  . Number of children: Not on file  . Years of education: Not on file  . Highest education level: Not on file  Occupational History  . Not on file  Tobacco Use  . Smoking status: Former Research scientist (life sciences)  . Smokeless tobacco: Never Used  Substance and Sexual Activity  . Alcohol use: Not on file  . Drug use: Not on file  . Sexual activity: Not on file  Other Topics Concern  . Not on file  Social History Narrative  . Not on file   Social Determinants of Health   Financial Resource Strain:   . Difficulty of Paying Living Expenses:   Food Insecurity:   . Worried About Charity fundraiser in the Last Year:   . Arboriculturist in the Last Year:   Transportation Needs:   . Film/video editor (Medical):   Marland Kitchen Lack of Transportation (Non-Medical):   Physical Activity:   . Days of Exercise per Week:   . Minutes of Exercise per Session:   Stress:   . Feeling of Stress :   Social Connections:   . Frequency of Communication with Friends and Family:   . Frequency of Social Gatherings with Friends and Family:   . Attends Religious Services:   . Active Member of Clubs or Organizations:   . Attends Archivist Meetings:   Marland Kitchen Marital Status:   Intimate Partner Violence:   . Fear of Current or Ex-Partner:   . Emotionally Abused:   Marland Kitchen Physically Abused:   . Sexually Abused:     Past Medical History, Surgical history, Social history, and Family history were reviewed and updated as appropriate.   Please see review of systems for further details on the patient's review from today.   Objective:   Physical Exam:  Wt 123 lb (55.8 kg)   Physical Exam Constitutional:       General: She is not in acute distress. Musculoskeletal:        General: No deformity.  Neurological:     Mental Status: She is alert and oriented to person, place, and time.     Coordination: Coordination normal.  Psychiatric:        Attention and Perception: Attention and perception normal. She does not perceive auditory or visual hallucinations.        Mood  and Affect: Mood is anxious and depressed. Affect is not labile, blunt, angry or inappropriate.        Speech: Speech normal.        Behavior: Behavior normal.        Thought Content: Thought content normal. Thought content is not paranoid or delusional. Thought content does not include homicidal or suicidal ideation. Thought content does not include homicidal or suicidal plan.        Cognition and Memory: Cognition and memory normal.        Judgment: Judgment normal.     Comments: Insight intact     Lab Review:     Component Value Date/Time   NA 134 (L) 04/12/2016 0930   K 3.8 04/12/2016 0930   CL 101 04/12/2016 0930   CO2 26 04/12/2016 0930   GLUCOSE 109 (H) 04/12/2016 0930   BUN 15 04/12/2016 0930   CREATININE 0.69 04/12/2016 0930   CALCIUM 9.0 04/12/2016 0930   PROT 7.4 04/12/2016 0930   ALBUMIN 3.8 04/12/2016 0930   AST 26 04/12/2016 0930   ALT 23 04/12/2016 0930   ALKPHOS 53 04/12/2016 0930   BILITOT 1.1 04/12/2016 0930   GFRNONAA >60 04/12/2016 0930   GFRAA >60 04/12/2016 0930       Component Value Date/Time   WBC 8.0 04/12/2016 0930   RBC 3.87 04/12/2016 0930   HGB 13.2 04/12/2016 0930   HCT 37.8 04/12/2016 0930   PLT 210 04/12/2016 0930   MCV 97.7 04/12/2016 0930   MCH 34.1 (H) 04/12/2016 0930   MCHC 34.9 04/12/2016 0930   RDW 12.0 04/12/2016 0930   LYMPHSABS 1.0 04/12/2016 0930   MONOABS 0.8 04/12/2016 0930   EOSABS 0.1 04/12/2016 0930   BASOSABS 0.0 04/12/2016 0930    No results found for: POCLITH, LITHIUM   No results found for: PHENYTOIN, PHENOBARB, VALPROATE, CBMZ   .res Assessment:  Plan:   Discussed increasing Prozac to 60 mg daily since patient reports some worsening in depressive signs and symptoms since switching from Paxil to Prozac 40 mg daily.  Discussed that higher doses may be needed to adequately control mood and anxiety signs and symptoms. Continue BuSpar 30 mg twice daily for anxiety. Continue clorazepate 7.5 mg 1/2 to 1 tablet twice daily as needed for anxiety. Discussed that it may be helpful to resume therapy and patient agrees that it may be helpful to resume therapy with Bradley Ferris, LC [facility].  Patient provided with a therapist contact information. Patient to follow-up with this provider in 4 weeks or sooner if clinically indicated. Patient advised to contact office with any questions, adverse effects, or acute worsening in signs and symptoms.  Reanna was seen today for depression and anxiety.  Diagnoses and all orders for this visit:  Mixed obsessional thoughts and acts -     FLUoxetine (PROZAC) 20 MG capsule; Take 3 capsules (60 mg total) by mouth daily.  Moderate episode of recurrent major depressive disorder (HCC) -     FLUoxetine (PROZAC) 20 MG capsule; Take 3 capsules (60 mg total) by mouth daily.     Please see After Visit Summary for patient specific instructions.  Future Appointments  Date Time Provider Department Center  05/14/2020 11:45 AM Corie Chiquito, PMHNP [facility] None    No orders of the defined types were placed in this encounter.   -------------------------------

## 2020-05-14 ENCOUNTER — Encounter: Payer: Self-pay | Admitting: Psychiatry

## 2020-05-14 ENCOUNTER — Other Ambulatory Visit: Payer: Self-pay

## 2020-05-14 ENCOUNTER — Ambulatory Visit (INDEPENDENT_AMBULATORY_CARE_PROVIDER_SITE_OTHER): Payer: BC Managed Care – PPO | Admitting: Psychiatry

## 2020-05-14 ENCOUNTER — Telehealth: Payer: Self-pay | Admitting: Psychiatry

## 2020-05-14 VITALS — Wt 122.0 lb

## 2020-05-14 DIAGNOSIS — F3341 Major depressive disorder, recurrent, in partial remission: Secondary | ICD-10-CM

## 2020-05-14 DIAGNOSIS — F331 Major depressive disorder, recurrent, moderate: Secondary | ICD-10-CM

## 2020-05-14 DIAGNOSIS — F422 Mixed obsessional thoughts and acts: Secondary | ICD-10-CM | POA: Diagnosis not present

## 2020-05-14 MED ORDER — CLORAZEPATE DIPOTASSIUM 7.5 MG PO TABS: ORAL_TABLET | ORAL | 2 refills | Status: DC

## 2020-05-14 MED ORDER — ARIPIPRAZOLE 5 MG PO TABS: ORAL_TABLET | ORAL | 1 refills | Status: DC

## 2020-05-14 NOTE — Telephone Encounter (Signed)
Had appt today and was researching Abilify. She is concerned about taking this medication and not sure she will start it.

## 2020-05-14 NOTE — Progress Notes (Signed)
Amber Pratt [id number] [birth year] 64 y.o.  Subjective:   Patient ID:  Amber Pratt is a 64 y.o. (DOB 1956/12/02) female.  Chief Complaint:  Chief Complaint  Patient presents with  . Anxiety  . Depression    HPI Amber Pratt presents to the office today for follow-up of mood and anxiety. She reports that she has not seen a significant change since increase in Prozac. She reports having "more anxiety... all these thoughts running through my head when I wake up." Anxiety is the worst upon awakening and improves but persists throughout the day. She reports frequent worry. Has anticipatory anxiety about driving by herself to the beach in 2 weeks. Denies any recent obsessions or compulsions. She reports some panic s/s in the morning. "I don't feel happy." She reports frequent sad mood. She reports that she "had a hard time being alone" when her husband was gone for a week. Reports that she does "not feel as with it." Reports that her concentration is improved after taking Clorazepate. "I'm om a depression." She reports that she has been eating more than she was previously. Reports her appetite has been decreased. Continuing to lose weight.Reports that she has been eating healthier foods since her husband is trying to lose weight. She reports that her energy has been "pretty good." Motivation has been lower and did not want to go on a camping trip with her husband. Has not been as interested in her usual activities and has not completed a painting that she had started. Sleeping well. Typically sleeping 9-10 hours a night. She reports that she is sleeping a little more than usual for her. Denies SI.   Takes 1/2 of a Clorazepate in the morning and in the afternoon.  Travels to [place] to see her friends due to not having any friends in her area. Meets with friends weekly for dinner. Also goes to see her mother in [place]. Has beach trip planned in 2 weeks to meet her sons. She reports that her  husband likes to do different outdoor activities. Has one son in [place], [place]. Her oldest son in in [place] and is dealing with some depression. Her middle son, Amber Pratt, has had multiple head injuries and reports that he will "go off on tangents."   Has not seen Bradley Ferris, [facility].  Past medication trials: Sertraline-ineffective, diarrhea Prozac-effective for 20 years and then no longer effective.Helped with both anxiety and depression. Effexor XR Paxil Anafranil-patient reports that it "made me loopy." BuSpar-helpful Clorazepate-effective Propranolol     Review of Systems:  Review of Systems  Gastrointestinal: Positive for abdominal distention and abdominal pain.  Musculoskeletal: Negative for gait problem.  Neurological: Negative for tremors.  Psychiatric/Behavioral:       Please refer to HPI    Medications: I have reviewed the patient's current medications.  Current Outpatient Medications  Medication Sig Dispense Refill  . Ascorbic Acid (VITAMIN C) 100 MG tablet Take 100 mg by mouth daily.    Marland Kitchen aspirin EC 81 MG tablet Take 81 mg by mouth 2 (two) times a week.     . busPIRone (BUSPAR) 30 MG tablet Take 1 tablet (30 mg total) by mouth 2 (two) times daily. Take 1 Tablet by mouth Twice daily. 180 tablet 1  . calcium carbonate (OS-CAL - DOSED IN MG OF ELEMENTAL CALCIUM) 1250 (500 Ca) MG tablet Take 1 tablet by mouth.    . clorazepate (TRANXENE) 7.5 MG tablet Take 1/2-1 po BID prn anxiety 45 tablet 2  .  estradiol (ESTRACE) 0.5 MG tablet Take 0.5 mg by mouth daily.    Marland Kitchen FLUoxetine (PROZAC) 20 MG capsule Take 3 capsules (60 mg total) by mouth daily. 90 capsule 0  . hydrochlorothiazide (HYDRODIURIL) 25 MG tablet Take 25 mg by mouth daily.    Marland Kitchen ibuprofen (ADVIL,MOTRIN) 200 MG tablet Take 200 mg by mouth every 6 (six) hours as needed.    . Lactobacillus (ACIDOPHILUS PROBIOTIC) 10 MG TABS Take 10 mg by mouth 3 (three) times daily. 30 tablet 0  . Multiple Vitamins-Minerals (HAIR SKIN AND  NAILS FORMULA PO) Take by mouth.    . olmesartan (BENICAR) 20 MG tablet   1  . Omega-3 Fatty Acids (FISH OIL) 1000 MG CAPS Take by mouth.    . Vitamin D, Cholecalciferol, 10 MCG (400 UNIT) CAPS Take by mouth.    . vitamin E 100 UNIT capsule Take by mouth daily.    . ARIPiprazole (ABILIFY) 5 MG tablet Take 1/2 tablet daily for 5-7 days, then may increase to 1 tablet daily as tolerated. 30 tablet 1   No current facility-administered medications for this visit.    Medication Side Effects: None  Allergies:  Allergies  Allergen Reactions  . Cephalosporins   . Latex   . Sulfa Antibiotics     Past Medical History:  Diagnosis Date  . Hypertension   . Varicose veins of both lower extremities     Family History  Problem Relation Age of Onset  . Anxiety disorder Mother   . Depression Mother   . Alcohol abuse Father   . Alcohol abuse Maternal Aunt   . Schizophrenia Maternal Uncle   . Anxiety disorder Sister   . Depression Sister   . Alcohol abuse Sister   . Anxiety disorder Sister   . Depression Sister     Social History   Socioeconomic History  . Marital status: Married    Spouse name: Not on file  . Number of children: Not on file  . Years of education: Not on file  . Highest education level: Not on file  Occupational History  . Not on file  Tobacco Use  . Smoking status: Former Research scientist (life sciences)  . Smokeless tobacco: Never Used  Substance and Sexual Activity  . Alcohol use: Not on file  . Drug use: Not on file  . Sexual activity: Not on file  Other Topics Concern  . Not on file  Social History Narrative  . Not on file   Social Determinants of Health   Financial Resource Strain:   . Difficulty of Paying Living Expenses:   Food Insecurity:   . Worried About Charity fundraiser in the Last Year:   . Arboriculturist in the Last Year:   Transportation Needs:   . Film/video editor (Medical):   Marland Kitchen Lack of Transportation (Non-Medical):   Physical Activity:   . Days of  Exercise per Week:   . Minutes of Exercise per Session:   Stress:   . Feeling of Stress :   Social Connections:   . Frequency of Communication with Friends and Family:   . Frequency of Social Gatherings with Friends and Family:   . Attends Religious Services:   . Active Member of Clubs or Organizations:   . Attends Archivist Meetings:   Marland Kitchen Marital Status:   Intimate Partner Violence:   . Fear of Current or Ex-Partner:   . Emotionally Abused:   Marland Kitchen Physically Abused:   . Sexually Abused:  Past Medical History, Surgical history, Social history, and Family history were reviewed and updated as appropriate.   Please see review of systems for further details on the patient's review from today.   Objective:   Physical Exam:  Wt 122 lb (55.3 kg)   Physical Exam Constitutional:      General: She is not in acute distress. Musculoskeletal:        General: No deformity.  Neurological:     Mental Status: She is alert and oriented to person, place, and time.     Coordination: Coordination normal.  Psychiatric:        Attention and Perception: Attention and perception normal. She does not perceive auditory or visual hallucinations.        Mood and Affect: Mood is anxious and depressed. Affect is not labile, blunt, angry or inappropriate.        Speech: Speech normal.        Behavior: Behavior normal.        Thought Content: Thought content normal. Thought content is not paranoid or delusional. Thought content does not include homicidal or suicidal ideation. Thought content does not include homicidal or suicidal plan.        Cognition and Memory: Cognition and memory normal.        Judgment: Judgment normal.     Comments: Insight intact     Lab Review:     Component Value Date/Time   NA 134 (L) 04/12/2016 0930   K 3.8 04/12/2016 0930   CL 101 04/12/2016 0930   CO2 26 04/12/2016 0930   GLUCOSE 109 (H) 04/12/2016 0930   BUN 15 04/12/2016 0930   CREATININE 0.69  04/12/2016 0930   CALCIUM 9.0 04/12/2016 0930   PROT 7.4 04/12/2016 0930   ALBUMIN 3.8 04/12/2016 0930   AST 26 04/12/2016 0930   ALT 23 04/12/2016 0930   ALKPHOS 53 04/12/2016 0930   BILITOT 1.1 04/12/2016 0930   GFRNONAA >60 04/12/2016 0930   GFRAA >60 04/12/2016 0930       Component Value Date/Time   WBC 8.0 04/12/2016 0930   RBC 3.87 04/12/2016 0930   HGB 13.2 04/12/2016 0930   HCT 37.8 04/12/2016 0930   PLT 210 04/12/2016 0930   MCV 97.7 04/12/2016 0930   MCH 34.1 (H) 04/12/2016 0930   MCHC 34.9 04/12/2016 0930   RDW 12.0 04/12/2016 0930   LYMPHSABS 1.0 04/12/2016 0930   MONOABS 0.8 04/12/2016 0930   EOSABS 0.1 04/12/2016 0930   BASOSABS 0.0 04/12/2016 0930    No results found for: POCLITH, LITHIUM   No results found for: PHENYTOIN, PHENOBARB, VALPROATE, CBMZ   .res Assessment: Plan:   Increase Clorazepate to 1 tab in q am and continue 1/2 tab in the afternoon.  Discussed potential benefits, risks, and side effects of Abilify. Discussed potential metabolic side effects associated with atypical antipsychotics, as well as potential risk for movement side effects. Advised pt to contact office if movement side effects occur. Pt agrees to trial of Abilify. Will start Abilify 5 mg 1/2 tab po qd for 5-7 days, then may increase to 1 tab daily as tolerated.  Continue Prozac 60 mg po qd for depression and anxiety.  Continue Buspar 30 mg po BID for anxiety.  Pt to f/u in 4 weeks or sooner if clinically indicated.  Patient advised to contact office with any questions, adverse effects, or acute worsening in signs and symptoms.  Alekhya was seen today for anxiety and depression.  Diagnoses  and all orders for this visit:  Mixed obsessional thoughts and acts -     ARIPiprazole (ABILIFY) 5 MG tablet; Take 1/2 tablet daily for 5-7 days, then may increase to 1 tablet daily as tolerated. -     clorazepate (TRANXENE) 7.5 MG tablet; Take 1/2-1 po BID prn anxiety  Recurrent major  depressive disorder, in partial remission (HCC)  Moderate episode of recurrent major depressive disorder (HCC) -     ARIPiprazole (ABILIFY) 5 MG tablet; Take 1/2 tablet daily for 5-7 days, then may increase to 1 tablet daily as tolerated.     Please see After Visit Summary for patient specific instructions.  Future Appointments  Date Time Provider Department Center  06/11/2020 10:30 AM Corie Chiquito, PMHNP [facility] None    No orders of the defined types were placed in this encounter.   -------------------------------

## 2020-05-14 NOTE — Patient Instructions (Signed)
Start Abilify (Aripiprazole) 5 mg 1/2 tablet daily for 5-7 days, then increase to 1 tablet daily as tolerated. Recommend starting it in the morning, but if it makes you sleepy would change to bedtime.   Try taking a whole Clorazepate in the morning to help with anxiety upon awakening and then continue to take a 1/2 tablet in the afternoon.   Continue taking Prozac three 20 mg capsules daily.

## 2020-05-15 ENCOUNTER — Telehealth: Payer: Self-pay | Admitting: Psychiatry

## 2020-05-15 NOTE — Telephone Encounter (Signed)
Pt left message stating she does not want to take Abilify 5 mg. Don't feel comfortable with info about the med. Please return call @ [phone]

## 2020-05-15 NOTE — Telephone Encounter (Signed)
Amber Pratt called and reported that she is going to try the Abilify, but she is very leery of starting this medication.  Please call to answer a few questions she has before beginning.  Appt 6/21

## 2020-05-15 NOTE — Telephone Encounter (Signed)
Left patient a message to call back to discuss

## 2020-05-17 NOTE — Telephone Encounter (Signed)
See other phone messages.  

## 2020-05-23 ENCOUNTER — Other Ambulatory Visit: Payer: Self-pay

## 2020-05-23 DIAGNOSIS — F422 Mixed obsessional thoughts and acts: Secondary | ICD-10-CM

## 2020-05-23 DIAGNOSIS — F331 Major depressive disorder, recurrent, moderate: Secondary | ICD-10-CM

## 2020-05-23 MED ORDER — FLUOXETINE HCL 20 MG PO CAPS: 60.0000 mg | ORAL_CAPSULE | Freq: Every day | ORAL | 2 refills | Status: DC

## 2020-06-11 ENCOUNTER — Other Ambulatory Visit: Payer: Self-pay

## 2020-06-11 ENCOUNTER — Encounter: Payer: Self-pay | Admitting: Psychiatry

## 2020-06-11 ENCOUNTER — Ambulatory Visit (INDEPENDENT_AMBULATORY_CARE_PROVIDER_SITE_OTHER): Payer: BC Managed Care – PPO | Admitting: Psychiatry

## 2020-06-11 DIAGNOSIS — F331 Major depressive disorder, recurrent, moderate: Secondary | ICD-10-CM

## 2020-06-11 DIAGNOSIS — F422 Mixed obsessional thoughts and acts: Secondary | ICD-10-CM

## 2020-06-11 NOTE — Progress Notes (Signed)
Amber Pratt [id number] 1956/05/07 64 y.o.  Subjective:   Patient ID:  Amber Pratt is a 64 y.o. (DOB 1956-02-09) female.  Chief Complaint:  Chief Complaint  Patient presents with   Depression   Anxiety    HPI Amber Pratt presents to the office today for follow-up of anxiety and depression. She reports that others have noticed that she will repeat herself and she reports, "I will get anxious and think of something to say." She reports that she feels nervous when her sons are in and will think about what she is going to do to entertain them or feed them. She reports "I want to be everything I can be while they are there... I am feeling bad about myself." She reports that her mood has been persistently sad. She reports that Clorazepate is helpful for her anxiety. She reports that she feels she can concentrate and remember better after taking Clorazepate. She reports that she took Abilify x 1 and did not take more due to some concerns after reading drug information. She reports, "I feel panic" without full blown panic attacks. She reports "an overwhelming feeling of, 'what do I do next? How do I solve this problem?' " Reports that she is fearful that she will say something wrong in conversations. Denies any obsessions or compulsions. Sleeping well. "I am not as depressed as I was. I am not having that constant depression."  Appetite has improved some and is not yet at baseline. Trying to eat more foods to maintain weight. Energy and motivation are slightly low. Denies SI- "I want to live."   Reports that she had all 3 of her sons at home at different times in the last month. Reports that her mother has not been doing well and has significant memory issues. She reports that she has been enjoying planting flowers. 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     Past medication trials: Sertraline-ineffective, diarrhea Prozac-effective for 20 years and then no  longer effective.Helped with both anxiety and depression. Effexor XR Paxil Anafranil-patient reports that it "made me loopy." BuSpar-helpful Clorazepate-effective Propranolol   Review of Systems:  Review of Systems  Musculoskeletal: Negative for gait problem.  Neurological: Positive for tremors.  Psychiatric/Behavioral:       Please refer to HPI    Medications: I have reviewed the patient's current medications.  Current Outpatient Medications  Medication Sig Dispense Refill   Ascorbic Acid (VITAMIN C) 100 MG tablet Take 100 mg by mouth daily.     aspirin EC 81 MG tablet Take 81 mg by mouth 2 (two) times a week.      busPIRone (BUSPAR) 30 MG tablet Take 1 tablet (30 mg total) by mouth 2 (two) times daily. Take 1 Tablet by mouth Twice daily. 180 tablet 1   calcium carbonate (OS-CAL - DOSED IN MG OF ELEMENTAL CALCIUM) 1250 (500 Ca) MG tablet Take 1 tablet by mouth.     clorazepate (TRANXENE) 7.5 MG tablet Take 1/2-1 po BID prn anxiety 45 tablet 2   estradiol (ESTRACE) 0.5 MG tablet Take 0.5 mg by mouth daily.     FLUoxetine (PROZAC) 20 MG capsule Take 3 capsules (60 mg total) by mouth daily. 90 capsule 2   hydrochlorothiazide (HYDRODIURIL) 25 MG tablet Take 25 mg by mouth daily.     ibuprofen (ADVIL,MOTRIN) 200 MG tablet Take 200 mg by mouth every 6 (six) hours as needed.     Lactobacillus (ACIDOPHILUS PROBIOTIC) 10  MG TABS Take 10 mg by mouth 3 (three) times daily. 30 tablet 0   Multiple Vitamins-Minerals (HAIR SKIN AND NAILS FORMULA PO) Take by mouth.     olmesartan (BENICAR) 20 MG tablet   1   Omega-3 Fatty Acids (FISH OIL) 1000 MG CAPS Take by mouth.     Vitamin D, Cholecalciferol, 10 MCG (400 UNIT) CAPS Take by mouth.     vitamin E 100 UNIT capsule Take by mouth daily.     ARIPiprazole (ABILIFY) 5 MG tablet Take 1/2 tablet daily for 5-7 days, then may increase to 1 tablet daily as tolerated. (Patient not taking: Reported on 06/11/2020) 30 tablet 1   No  current facility-administered medications for this visit.    Medication Side Effects: None  Allergies:  Allergies  Allergen Reactions   Cephalosporins    Latex    Sulfa Antibiotics     Past Medical History:  Diagnosis Date   Hypertension    Varicose veins of both lower extremities     Family History  Problem Relation Age of Onset   Anxiety disorder Mother    Depression Mother    Alcohol abuse Father    Alcohol abuse Maternal Aunt    Schizophrenia Maternal Uncle    Anxiety disorder Sister    Depression Sister    Alcohol abuse Sister    Anxiety disorder Sister    Depression Sister     Social History   Socioeconomic History   Marital status: Married    Spouse name: Not on file   Number of children: Not on file   Years of education: Not on file   Highest education level: Not on file  Occupational History   Not on file  Tobacco Use   Smoking status: Former Smoker   Smokeless tobacco: Never Used  Substance and Sexual Activity   Alcohol use: Not on file   Drug use: Not on file   Sexual activity: Not on file  Other Topics Concern   Not on file  Social History Narrative   Not on file   Social Determinants of Health   Financial Resource Strain:    Difficulty of Paying Living Expenses:   Food Insecurity:    Worried About Programme researcher, broadcasting/film/video in the Last Year:    Barista in the Last Year:   Transportation Needs:    Freight forwarder (Medical):    Lack of Transportation (Non-Medical):   Physical Activity:    Days of Exercise per Week:    Minutes of Exercise per Session:   Stress:    Feeling of Stress :   Social Connections:    Frequency of Communication with Friends and Family:    Frequency of Social Gatherings with Friends and Family:    Attends Religious Services:    Active Member of Clubs or Organizations:    Attends Engineer, structural:    Marital Status:   Intimate Partner Violence:     Fear of Current or Ex-Partner:    Emotionally Abused:    Physically Abused:    Sexually Abused:     Past Medical History, Surgical history, Social history, and Family history were reviewed and updated as appropriate.   Please see review of systems for further details on the patient's review from today.   Objective:   Physical Exam:  There were no vitals taken for this visit.  Physical Exam Constitutional:      General: She is not in acute distress.  Musculoskeletal:        General: No deformity.  Neurological:     Mental Status: She is alert and oriented to person, place, and time.     Coordination: Coordination normal.  Psychiatric:        Attention and Perception: Attention and perception normal. She does not perceive auditory or visual hallucinations.        Mood and Affect: Mood is anxious and depressed. Affect is not labile, blunt, angry or inappropriate.        Speech: Speech normal.        Behavior: Behavior normal.        Thought Content: Thought content normal. Thought content is not paranoid or delusional. Thought content does not include homicidal or suicidal ideation. Thought content does not include homicidal or suicidal plan.        Cognition and Memory: Cognition and memory normal.        Judgment: Judgment normal.     Comments: Insight intact     Lab Review:     Component Value Date/Time   NA 134 (L) 04/12/2016 0930   K 3.8 04/12/2016 0930   CL 101 04/12/2016 0930   CO2 26 04/12/2016 0930   GLUCOSE 109 (H) 04/12/2016 0930   BUN 15 04/12/2016 0930   CREATININE 0.69 04/12/2016 0930   CALCIUM 9.0 04/12/2016 0930   PROT 7.4 04/12/2016 0930   ALBUMIN 3.8 04/12/2016 0930   AST 26 04/12/2016 0930   ALT 23 04/12/2016 0930   ALKPHOS 53 04/12/2016 0930   BILITOT 1.1 04/12/2016 0930   GFRNONAA >60 04/12/2016 0930   GFRAA >60 04/12/2016 0930       Component Value Date/Time   WBC 8.0 04/12/2016 0930   RBC 3.87 04/12/2016 0930   HGB 13.2 04/12/2016  0930   HCT 37.8 04/12/2016 0930   PLT 210 04/12/2016 0930   MCV 97.7 04/12/2016 0930   MCH 34.1 (H) 04/12/2016 0930   MCHC 34.9 04/12/2016 0930   RDW 12.0 04/12/2016 0930   LYMPHSABS 1.0 04/12/2016 0930   MONOABS 0.8 04/12/2016 0930   EOSABS 0.1 04/12/2016 0930   BASOSABS 0.0 04/12/2016 0930    No results found for: POCLITH, LITHIUM   No results found for: PHENYTOIN, PHENOBARB, VALPROATE, CBMZ   .res Assessment: Plan:   Patient seen for 30 minutes and time spent counseling patient regarding treatment options for depression.  Discussed patient's concerns about possible side effects with Abilify.  Patient agrees to trial of Abilify.  She reports that she had difficulty splitting 5 mg tablets and will therefore start one Abilify 5 mg tablet in the morning for augmentation of depression. Encourage patient to consider resuming psychotherapy since patient reports that depression may be somewhat situational in response to living in an area where she has few neighbors and no longer has some of her previous activities. Continue BuSpar 30 mg twice daily for anxiety. Continue clorazepate for anxiety. Continue fluoxetine 60 mg daily for depression and anxiety since patient reports that depression has been less severe with Prozac. Patient to follow-up in 4 weeks or sooner if clinically indicated. Patient advised to contact office with any questions, adverse effects, or acute worsening in signs and symptoms.   Amber Pratt was seen today for depression and anxiety.  Diagnoses and all orders for this visit:  Mixed obsessional thoughts and acts  Moderate episode of recurrent major depressive disorder ([place])     Please see After Visit Summary for patient specific instructions.  Future  Appointments  Date Time Provider Department Center  07/10/2020 11:30 AM Corie Chiquito, PMHNP [facility] None    No orders of the defined types were placed in this encounter.   -------------------------------

## 2020-07-10 ENCOUNTER — Ambulatory Visit: Payer: BC Managed Care – PPO | Admitting: Psychiatry

## 2020-07-25 ENCOUNTER — Other Ambulatory Visit: Payer: Self-pay

## 2020-07-25 ENCOUNTER — Ambulatory Visit (INDEPENDENT_AMBULATORY_CARE_PROVIDER_SITE_OTHER): Payer: BC Managed Care – PPO | Admitting: Psychiatry

## 2020-07-25 ENCOUNTER — Encounter: Payer: Self-pay | Admitting: Psychiatry

## 2020-07-25 DIAGNOSIS — F331 Major depressive disorder, recurrent, moderate: Secondary | ICD-10-CM | POA: Diagnosis not present

## 2020-07-25 DIAGNOSIS — F422 Mixed obsessional thoughts and acts: Secondary | ICD-10-CM | POA: Diagnosis not present

## 2020-07-25 MED ORDER — FLUOXETINE HCL 20 MG PO CAPS: 60.0000 mg | ORAL_CAPSULE | Freq: Every day | ORAL | 2 refills | Status: DC

## 2020-07-25 MED ORDER — CLORAZEPATE DIPOTASSIUM 7.5 MG PO TABS: 7.5000 mg | ORAL_TABLET | Freq: Two times a day (BID) | ORAL | 1 refills | Status: DC

## 2020-07-25 NOTE — Progress Notes (Signed)
Amber Pratt [id number] 1956-12-17 64 y.o.  Subjective:   Patient ID:  Amber Pratt is a 64 y.o. (DOB 1956-07-11) female.  Chief Complaint:  Chief Complaint  Patient presents with  . Anxiety  . Depression    HPI [name] presents to the office today for follow-up of depression and anxiety. She reports that her "memory is not as good as it should be.... Some days are great and some days are not." She reports that anxiety seems to be the cause of memory difficulties. She reports, "I'm dealing with a lot of anxiety and I don't know where it is coming from. I don't feel like myself." She reports worsening anxiety with some panic s/s. She reports that anxiety is worse in the morning and some days persists throughout the day. Reports feeling uneasy. She reports some worry, particularly about son with h/o head injuries. Some rumination. Denies any obsessions or compulsions.   She reports sad mood. Sleeping well. Appetite has been ok. Energy and motivation are ok- "I just get bored." She reports adequate concentration. She enjoys watching certain TV shows. Reports that she has been spending most of her time at home. Denies SI.   Reports that Prozac has been helpful for her depression. Reports that she had decreased Prozac to 40 mg qd and increased to 60 mg qd again this past week.  Denies any disorientation.  Reports that she has not tried taking Aripiprazole. Has not seen therapist.   Taking Clorazepate 1/2 tab po BID. Reports that she has not tried taking one tablet. She reports that her thinking is clearer after taking Clorazepate.   Past medication trials: Sertraline-ineffective, diarrhea Prozac-effective for 20 years and then no longer effective.Helped with both anxiety and depression. Effexor XR Paxil Anafranil-patient reports that it "made me loopy." BuSpar-helpful Clorazepate-effective Propranolol    Review of Systems:  Review of Systems  Musculoskeletal: Negative  for gait problem.  Neurological: Negative for tremors.  Psychiatric/Behavioral:       Please refer to HPI    Medications: I have reviewed the patient's current medications.  Current Outpatient Medications  Medication Sig Dispense Refill  . clorazepate (TRANXENE) 7.5 MG tablet Take 1 tablet (7.5 mg total) by mouth 2 (two) times daily. 60 tablet 1  . ARIPiprazole (ABILIFY) 5 MG tablet Take 1/2 tablet daily for 5-7 days, then may increase to 1 tablet daily as tolerated. (Patient not taking: Reported on 06/11/2020) 30 tablet 1  . Ascorbic Acid (VITAMIN C) 100 MG tablet Take 100 mg by mouth daily.    Marland Kitchen aspirin EC 81 MG tablet Take 81 mg by mouth 2 (two) times a week.     . busPIRone (BUSPAR) 30 MG tablet Take 1 tablet (30 mg total) by mouth 2 (two) times daily. Take 1 Tablet by mouth Twice daily. 180 tablet 1  . calcium carbonate (OS-CAL - DOSED IN MG OF ELEMENTAL CALCIUM) 1250 (500 Ca) MG tablet Take 1 tablet by mouth.    . estradiol (ESTRACE) 0.5 MG tablet Take 0.5 mg by mouth daily.    Marland Kitchen FLUoxetine (PROZAC) 20 MG capsule Take 3 capsules (60 mg total) by mouth daily. 90 capsule 2  . hydrochlorothiazide (HYDRODIURIL) 25 MG tablet Take 25 mg by mouth daily.    Marland Kitchen ibuprofen (ADVIL,MOTRIN) 200 MG tablet Take 200 mg by mouth every 6 (six) hours as needed.    . Lactobacillus (ACIDOPHILUS PROBIOTIC) 10 MG TABS Take 10 mg by mouth 3 (three) times daily. 30 tablet 0  .  Multiple Vitamins-Minerals (HAIR SKIN AND NAILS FORMULA PO) Take by mouth.    . olmesartan (BENICAR) 20 MG tablet   1  . Omega-3 Fatty Acids (FISH OIL) 1000 MG CAPS Take by mouth.    . Vitamin D, Cholecalciferol, 10 MCG (400 UNIT) CAPS Take by mouth.    . vitamin E 100 UNIT capsule Take by mouth daily.     No current facility-administered medications for this visit.    Medication Side Effects: None  Allergies:  Allergies  Allergen Reactions  . Cephalosporins   . Latex   . Sulfa Antibiotics     Past Medical History:   Diagnosis Date  . Hypertension   . Varicose veins of both lower extremities     Family History  Problem Relation Age of Onset  . Anxiety disorder Mother   . Depression Mother   . Alcohol abuse Father   . Alcohol abuse Maternal Aunt   . Schizophrenia Maternal Uncle   . Anxiety disorder Sister   . Depression Sister   . Alcohol abuse Sister   . Anxiety disorder Sister   . Depression Sister     Social History   Socioeconomic History  . Marital status: Married    Spouse name: Not on file  . Number of children: Not on file  . Years of education: Not on file  . Highest education level: Not on file  Occupational History  . Not on file  Tobacco Use  . Smoking status: Former Games developer  . Smokeless tobacco: Never Used  Substance and Sexual Activity  . Alcohol use: Not on file  . Drug use: Not on file  . Sexual activity: Not on file  Other Topics Concern  . Not on file  Social History Narrative  . Not on file   Social Determinants of Health   Financial Resource Strain:   . Difficulty of Paying Living Expenses:   Food Insecurity:   . Worried About Programme researcher, broadcasting/film/video in the Last Year:   . Barista in the Last Year:   Transportation Needs:   . Freight forwarder (Medical):   Marland Kitchen Lack of Transportation (Non-Medical):   Physical Activity:   . Days of Exercise per Week:   . Minutes of Exercise per Session:   Stress:   . Feeling of Stress :   Social Connections:   . Frequency of Communication with Friends and Family:   . Frequency of Social Gatherings with Friends and Family:   . Attends Religious Services:   . Active Member of Clubs or Organizations:   . Attends Banker Meetings:   Marland Kitchen Marital Status:   Intimate Partner Violence:   . Fear of Current or Ex-Partner:   . Emotionally Abused:   Marland Kitchen Physically Abused:   . Sexually Abused:     Past Medical History, Surgical history, Social history, and Family history were reviewed and updated as  appropriate.   Please see review of systems for further details on the patient's review from today.   Objective:   Physical Exam:  There were no vitals taken for this visit.  Physical Exam Constitutional:      General: She is not in acute distress. Musculoskeletal:        General: No deformity.  Neurological:     Mental Status: She is alert and oriented to person, place, and time.     Coordination: Coordination normal.  Psychiatric:        Attention and Perception:  Attention and perception normal. She does not perceive auditory or visual hallucinations.        Mood and Affect: Mood is anxious and depressed. Affect is not labile, blunt, angry or inappropriate.        Speech: Speech normal.        Behavior: Behavior normal.        Thought Content: Thought content normal. Thought content is not paranoid or delusional. Thought content does not include homicidal or suicidal ideation. Thought content does not include homicidal or suicidal plan.        Cognition and Memory: Cognition and memory normal.        Judgment: Judgment normal.     Comments: Insight intact     Lab Review:     Component Value Date/Time   NA 134 (L) 04/12/2016 0930   K 3.8 04/12/2016 0930   CL 101 04/12/2016 0930   CO2 26 04/12/2016 0930   GLUCOSE 109 (H) 04/12/2016 0930   BUN 15 04/12/2016 0930   CREATININE 0.69 04/12/2016 0930   CALCIUM 9.0 04/12/2016 0930   PROT 7.4 04/12/2016 0930   ALBUMIN 3.8 04/12/2016 0930   AST 26 04/12/2016 0930   ALT 23 04/12/2016 0930   ALKPHOS 53 04/12/2016 0930   BILITOT 1.1 04/12/2016 0930   GFRNONAA >60 04/12/2016 0930   GFRAA >60 04/12/2016 0930       Component Value Date/Time   WBC 8.0 04/12/2016 0930   RBC 3.87 04/12/2016 0930   HGB 13.2 04/12/2016 0930   HCT 37.8 04/12/2016 0930   PLT 210 04/12/2016 0930   MCV 97.7 04/12/2016 0930   MCH 34.1 (H) 04/12/2016 0930   MCHC 34.9 04/12/2016 0930   RDW 12.0 04/12/2016 0930   LYMPHSABS 1.0 04/12/2016 0930    MONOABS 0.8 04/12/2016 0930   EOSABS 0.1 04/12/2016 0930   BASOSABS 0.0 04/12/2016 0930    No results found for: POCLITH, LITHIUM   No results found for: PHENYTOIN, PHENOBARB, VALPROATE, CBMZ   .res Assessment: Plan:   Patient seen for 30 minutes and time spent counseling patient regarding treatment plan and treatment options. Encourage patient to take Prozac 60 mg daily for anxiety and depression since patient reports that she recently attempted to lower dose to 40 mg daily. Recommend increasing Tranxene to 7-1/2 mg twice daily to improve anxiety. Encouraged patient to consider starting Abilify 5 mg daily to improve mood and anxiety, particularly lately if she does not experience significant improvement with increase in Tranxene. Recommend considering restarting psychotherapy with Bradley Ferris, LC [facility]. Patient to follow-up in 4 weeks or sooner if clinically indicated. Patient advised to contact office with any questions, adverse effects, or acute worsening in signs and symptoms.  Demmi was seen today for anxiety and depression.  Diagnoses and all orders for this visit:  Mixed obsessional thoughts and acts -     clorazepate (TRANXENE) 7.5 MG tablet; Take 1 tablet (7.5 mg total) by mouth 2 (two) times daily. -     FLUoxetine (PROZAC) 20 MG capsule; Take 3 capsules (60 mg total) by mouth daily.  Moderate episode of recurrent major depressive disorder (HCC) -     FLUoxetine (PROZAC) 20 MG capsule; Take 3 capsules (60 mg total) by mouth daily.     Please see After Visit Summary for patient specific instructions.  Future Appointments  Date Time Provider Department Center  08/22/2020  1:30 PM Corie Chiquito, PMHNP [facility] None    No orders of the defined types were  placed in this encounter.   -------------------------------

## 2020-08-22 ENCOUNTER — Encounter: Payer: Self-pay | Admitting: Psychiatry

## 2020-08-22 ENCOUNTER — Other Ambulatory Visit: Payer: Self-pay

## 2020-08-22 ENCOUNTER — Ambulatory Visit (INDEPENDENT_AMBULATORY_CARE_PROVIDER_SITE_OTHER): Payer: BC Managed Care – PPO | Admitting: Psychiatry

## 2020-08-22 DIAGNOSIS — F3341 Major depressive disorder, recurrent, in partial remission: Secondary | ICD-10-CM | POA: Diagnosis not present

## 2020-08-22 DIAGNOSIS — F422 Mixed obsessional thoughts and acts: Secondary | ICD-10-CM | POA: Diagnosis not present

## 2020-08-22 MED ORDER — BUSPIRONE HCL 30 MG PO TABS: 30.0000 mg | ORAL_TABLET | Freq: Two times a day (BID) | ORAL | 1 refills | Status: AC

## 2020-08-22 MED ORDER — CLORAZEPATE DIPOTASSIUM 7.5 MG PO TABS: 7.5000 mg | ORAL_TABLET | Freq: Two times a day (BID) | ORAL | 2 refills | Status: AC

## 2020-08-22 NOTE — Progress Notes (Signed)
Amber Pratt [id number] 09-14-1956 64 y.o.  Subjective:   Patient ID:  Amber Pratt is a 64 y.o. (DOB April 13, 1956) female.  Chief Complaint:  Chief Complaint  Patient presents with  . Memory Loss  . Follow-up    Anxiety, depression    HPI Amber Pratt presents to the office today for follow-up of anxiety and depression. She is accompanied by her husband. She reports that she has been feeling "sort of out of it." She report that she recently fell. Denies dizziness or light-headedness. She reports that her energy has been low. She report feeling "foggy headed." She reports difficulty with concentration. She reports that her anxiety has been better controlled with 60 mg of Prozac. She reports that she has had some worry about her husband. Denies any obsessive thoughts. She has had some brief periods of depression. Continues to see friends regularly for lunch. She reports adequate sleep. Appetite has been stable. Denies SI.   Her husband reports that he has seen changes in her cognition in the past 2 years. He reports that she will confuse dates. He reports that PCP has referred her for neuropsych testing. He reports that she canceled it and seems to be unaware that she canceled the apt. He reports that she has been repeatedly been asking him when the apt will occur.   Past medication trials: Sertraline-ineffective, diarrhea Prozac-effective for 20 years and then no longer effective.Helped with both anxiety and depression. Effexor XR Paxil Anafranil-patient reports that it "made me loopy." BuSpar-helpful Clorazepate-effective Propranolol   Review of Systems:  Review of Systems  Musculoskeletal: Negative for gait problem.  Neurological: Negative for dizziness, tremors and light-headedness.  Hematological:       Recent bruising from fall   Psychiatric/Behavioral:       Please refer to HPI    Medications: I have reviewed the patient's current medications.  Current Outpatient  Medications  Medication Sig Dispense Refill  . [START ON 09/19/2020] clorazepate (TRANXENE) 7.5 MG tablet Take 1 tablet (7.5 mg total) by mouth 2 (two) times daily. 60 tablet 2  . FLUoxetine (PROZAC) 20 MG capsule Take 3 capsules (60 mg total) by mouth daily. 90 capsule 2  . olmesartan (BENICAR) 20 MG tablet   1  . Ascorbic Acid (VITAMIN C) 100 MG tablet Take 100 mg by mouth daily.    Marland Kitchen aspirin EC 81 MG tablet Take 81 mg by mouth 2 (two) times a week.     . busPIRone (BUSPAR) 30 MG tablet Take 1 tablet (30 mg total) by mouth 2 (two) times daily. Take 1 Tablet by mouth Twice daily. 180 tablet 1  . calcium carbonate (OS-CAL - DOSED IN MG OF ELEMENTAL CALCIUM) 1250 (500 Ca) MG tablet Take 1 tablet by mouth.    . estradiol (ESTRACE) 0.5 MG tablet Take 0.5 mg by mouth daily.    . hydrochlorothiazide (HYDRODIURIL) 25 MG tablet Take 25 mg by mouth daily.    Marland Kitchen ibuprofen (ADVIL,MOTRIN) 200 MG tablet Take 200 mg by mouth every 6 (six) hours as needed.    . Lactobacillus (ACIDOPHILUS PROBIOTIC) 10 MG TABS Take 10 mg by mouth 3 (three) times daily. 30 tablet 0  . Multiple Vitamins-Minerals (HAIR SKIN AND NAILS FORMULA PO) Take by mouth.    . Omega-3 Fatty Acids (FISH OIL) 1000 MG CAPS Take by mouth.    . Vitamin D, Cholecalciferol, 10 MCG (400 UNIT) CAPS Take by mouth.    . vitamin E 100 UNIT capsule Take by mouth daily.  No current facility-administered medications for this visit.    Medication Side Effects: None  Allergies:  Allergies  Allergen Reactions  . Cephalosporins   . Latex   . Sulfa Antibiotics     Past Medical History:  Diagnosis Date  . Hypertension   . Varicose veins of both lower extremities     Family History  Problem Relation Age of Onset  . Anxiety disorder Mother   . Depression Mother   . Alcohol abuse Father   . Alcohol abuse Maternal Aunt   . Schizophrenia Maternal Uncle   . Anxiety disorder Sister   . Depression Sister   . Alcohol abuse Sister   . Anxiety  disorder Sister   . Depression Sister     Social History   Socioeconomic History  . Marital status: Married    Spouse name: Not on file  . Number of children: Not on file  . Years of education: Not on file  . Highest education level: Not on file  Occupational History  . Not on file  Tobacco Use  . Smoking status: Former Games developer  . Smokeless tobacco: Never Used  Substance and Sexual Activity  . Alcohol use: Not on file  . Drug use: Not on file  . Sexual activity: Not on file  Other Topics Concern  . Not on file  Social History Narrative  . Not on file   Social Determinants of Health   Financial Resource Strain:   . Difficulty of Paying Living Expenses: Not on file  Food Insecurity:   . Worried About Programme researcher, broadcasting/film/video in the Last Year: Not on file  . Ran Out of Food in the Last Year: Not on file  Transportation Needs:   . Lack of Transportation (Medical): Not on file  . Lack of Transportation (Non-Medical): Not on file  Physical Activity:   . Days of Exercise per Week: Not on file  . Minutes of Exercise per Session: Not on file  Stress:   . Feeling of Stress : Not on file  Social Connections:   . Frequency of Communication with Friends and Family: Not on file  . Frequency of Social Gatherings with Friends and Family: Not on file  . Attends Religious Services: Not on file  . Active Member of Clubs or Organizations: Not on file  . Attends Banker Meetings: Not on file  . Marital Status: Not on file  Intimate Partner Violence:   . Fear of Current or Ex-Partner: Not on file  . Emotionally Abused: Not on file  . Physically Abused: Not on file  . Sexually Abused: Not on file    Past Medical History, Surgical history, Social history, and Family history were reviewed and updated as appropriate.   Please see review of systems for further details on the patient's review from today.   Objective:   Physical Exam:  There were no vitals taken for this  visit.  Physical Exam Constitutional:      General: She is not in acute distress. Musculoskeletal:        General: No deformity.  Neurological:     Mental Status: She is alert and oriented to person, place, and time.     Coordination: Coordination normal.  Psychiatric:        Attention and Perception: Attention and perception normal. She does not perceive auditory or visual hallucinations.        Mood and Affect: Mood is anxious. Affect is not labile, blunt, angry or  inappropriate.        Speech: Speech normal.        Behavior: Behavior normal.        Thought Content: Thought content normal. Thought content is not paranoid or delusional. Thought content does not include homicidal or suicidal ideation. Thought content does not include homicidal or suicidal plan.        Cognition and Memory: Cognition normal. She exhibits impaired recent memory.        Judgment: Judgment normal.     Comments: Insight intact Mood presents as mildly depressed      Lab Review:     Component Value Date/Time   NA 134 (L) 04/12/2016 0930   K 3.8 04/12/2016 0930   CL 101 04/12/2016 0930   CO2 26 04/12/2016 0930   GLUCOSE 109 (H) 04/12/2016 0930   BUN 15 04/12/2016 0930   CREATININE 0.69 04/12/2016 0930   CALCIUM 9.0 04/12/2016 0930   PROT 7.4 04/12/2016 0930   ALBUMIN 3.8 04/12/2016 0930   AST 26 04/12/2016 0930   ALT 23 04/12/2016 0930   ALKPHOS 53 04/12/2016 0930   BILITOT 1.1 04/12/2016 0930   GFRNONAA >60 04/12/2016 0930   GFRAA >60 04/12/2016 0930       Component Value Date/Time   WBC 8.0 04/12/2016 0930   RBC 3.87 04/12/2016 0930   HGB 13.2 04/12/2016 0930   HCT 37.8 04/12/2016 0930   PLT 210 04/12/2016 0930   MCV 97.7 04/12/2016 0930   MCH 34.1 (H) 04/12/2016 0930   MCHC 34.9 04/12/2016 0930   RDW 12.0 04/12/2016 0930   LYMPHSABS 1.0 04/12/2016 0930   MONOABS 0.8 04/12/2016 0930   EOSABS 0.1 04/12/2016 0930   BASOSABS 0.0 04/12/2016 0930    No results found for: POCLITH,  LITHIUM   No results found for: PHENYTOIN, PHENOBARB, VALPROATE, CBMZ   .res Assessment: Plan:   Agree with plan to follow through with neuropsych testing to evaluate ST memory impairment.  Recommend re-starting therapy with Bradley Ferris, [facility] to address situational stressors and discuss strategies to improve memory.  Reviewed medications and potential cognitive side effects.  Will continue Prozac 60 mg po qd for anxiety and depression. Continue Buspar 30 mg po BID for anxiety.  Continue Clorazepate 7.5 mg po BID for anxiety. Pt to f/u in 2-3 months or sooner if clinically indicated.  Patient advised to contact office with any questions, adverse effects, or acute worsening in signs and symptoms.  Katryn was seen today for memory loss and follow-up.  Diagnoses and all orders for this visit:  Mixed obsessional thoughts and acts -     busPIRone (BUSPAR) 30 MG tablet; Take 1 tablet (30 mg total) by mouth 2 (two) times daily. Take 1 Tablet by mouth Twice daily. -     clorazepate (TRANXENE) 7.5 MG tablet; Take 1 tablet (7.5 mg total) by mouth 2 (two) times daily.  Recurrent major depressive disorder, in partial remission (HCC)     Please see After Visit Summary for patient specific instructions.  Future Appointments  Date Time Provider Department Center  10/22/2020 11:00 AM Corie Chiquito, PMHNP [facility] None    No orders of the defined types were placed in this encounter.   -------------------------------

## 2020-10-22 ENCOUNTER — Ambulatory Visit: Payer: BC Managed Care – PPO | Admitting: Psychiatry

## 2020-12-12 ENCOUNTER — Telehealth: Payer: Self-pay | Admitting: Psychiatry

## 2020-12-12 DIAGNOSIS — F422 Mixed obsessional thoughts and acts: Secondary | ICD-10-CM

## 2020-12-12 DIAGNOSIS — F331 Major depressive disorder, recurrent, moderate: Secondary | ICD-10-CM

## 2020-12-12 MED ORDER — FLUOXETINE HCL 20 MG PO CAPS: 60.0000 mg | ORAL_CAPSULE | Freq: Every day | ORAL | 2 refills | Status: AC

## 2020-12-12 NOTE — Telephone Encounter (Signed)
Script sent  

## 2021-05-11 ENCOUNTER — Emergency Department (HOSPITAL_COMMUNITY): Payer: BC Managed Care – PPO

## 2021-05-11 ENCOUNTER — Emergency Department (HOSPITAL_COMMUNITY)
Admission: EM | Admit: 2021-05-11 | Discharge: 2021-05-11 | Disposition: A | Discharge status: Home / Self Care | Payer: BC Managed Care – PPO | Attending: Emergency Medicine | Admitting: Emergency Medicine

## 2021-05-11 DIAGNOSIS — I10 Essential (primary) hypertension: Secondary | ICD-10-CM | POA: Insufficient documentation

## 2021-05-11 DIAGNOSIS — Z91040 Latex allergy status: Secondary | ICD-10-CM | POA: Insufficient documentation

## 2021-05-11 DIAGNOSIS — Z7982 Long term (current) use of aspirin: Secondary | ICD-10-CM | POA: Diagnosis not present

## 2021-05-11 DIAGNOSIS — R634 Abnormal weight loss: Secondary | ICD-10-CM | POA: Insufficient documentation

## 2021-05-11 DIAGNOSIS — Z79899 Other long term (current) drug therapy: Secondary | ICD-10-CM | POA: Insufficient documentation

## 2021-05-11 DIAGNOSIS — Z87891 Personal history of nicotine dependence: Secondary | ICD-10-CM | POA: Insufficient documentation

## 2021-05-11 DIAGNOSIS — R0789 Other chest pain: Secondary | ICD-10-CM | POA: Insufficient documentation

## 2021-05-11 LAB — COMPREHENSIVE METABOLIC PANEL
ALT: 28 U/L (ref 0–44)
AST: 26 U/L (ref 15–41)
Albumin: 4.1 g/dL (ref 3.5–5.0)
Alkaline Phosphatase: 61 U/L (ref 38–126)
Anion gap: 8 (ref 5–15)
BUN: 25 mg/dL — ABNORMAL HIGH (ref 8–23)
CO2: 27 mmol/L (ref 22–32)
Calcium: 9.4 mg/dL (ref 8.9–10.3)
Chloride: 103 mmol/L (ref 98–111)
Creatinine, Ser: 0.64 mg/dL (ref 0.44–1.00)
GFR, Estimated: >60 mL/min (ref >60)
Glucose, Bld: 104 mg/dL — ABNORMAL HIGH (ref 70–99)
Potassium: 4.5 mmol/L (ref 3.5–5.1)
Sodium: 138 mmol/L (ref 135–145)
Total Bilirubin: 1 mg/dL (ref 0.3–1.2)
Total Protein: 6.9 g/dL (ref 6.5–8.1)

## 2021-05-11 LAB — CBC
HCT: 41.1 % (ref 36.0–46.0)
Hemoglobin: 14 g/dL (ref 12.0–15.0)
MCH: 33.7 pg (ref 26.0–34.0)
MCHC: 34.1 g/dL (ref 30.0–36.0)
MCV: 99 fL (ref 80.0–100.0)
Platelets: 230 10*3/uL (ref 150–400)
RBC: 4.15 MIL/uL (ref 3.87–5.11)
RDW: 12.2 % (ref 11.5–15.5)
WBC: 8.9 10*3/uL (ref 4.0–10.5)
nRBC: 0 % (ref 0.0–0.2)

## 2021-05-11 LAB — TROPONIN I (HIGH SENSITIVITY): Troponin I (High Sensitivity): 2 ng/L (ref <18)

## 2021-05-11 NOTE — ED Triage Notes (Addendum)
Patient reports left arm pain/tingling and left sided chest pain starting this past week. Denies n/v. Patient reports she has been going through a lot lately, has anxiety , mother just died 2 days ago. Does not take medications for anxiety

## 2021-05-11 NOTE — ED Provider Notes (Signed)
[facility]-EMERGENCY DEPT Provider Note   CSN: [id number] Arrival date & time: 05/11/21  1112     History Chief Complaint  Patient presents with  . left arm pain  . Chest Pain    Amber Pratt is a 65 y.o. female.  65 year old female with history of hypertension presents with complaint of a throbbing sensation in her left upper chest/left upper outer breast and left axillary area for the past 2 to 3 weeks.  Patient states that discomfort improves if she can distract herself however returns if she is resting.  Nothing makes this sensation worse including exertion, movement, deep breathing.  Denies any associated nausea, vomiting, shortness of breath, diaphoresis.  Patient is due for a mammogram.  Reports that she has had unintentional weight loss of 7 pounds (117 down to 110 pounds) over the past 6 months despite good appetite and trying to increase p.o. intake.  Patient is due for colonoscopy in 2 years.  States that she has been under a lot of stress recently due to the recent death of her mother 2 days ago.  Unsure if this is related.  No other complaints or concerns.     HPI: A 65 year old patient with a history of hypertension presents for evaluation of chest pain. Initial onset of pain was more than 6 hours ago. The patient's chest pain is well-localized and is not worse with exertion. The patient's chest pain is middle- or left-sided, is not described as heaviness/pressure/tightness, is not sharp and does not radiate to the arms/jaw/neck. The patient does not complain of nausea and denies diaphoresis. The patient has no history of stroke, has no history of peripheral artery disease, has not smoked in the past 90 days, denies any history of treated diabetes, has no relevant family history of coronary artery disease (first degree relative at less than age 27), has no history of hypercholesterolemia and does not have an elevated BMI (>=30).   Past Medical History:   Diagnosis Date  . Hypertension   . Varicose veins of both lower extremities     Patient Active Problem List   Diagnosis Date Noted  . Mixed obsessional thoughts and acts 11/17/2018  . Major depressive disorder, recurrent episode, moderate (HCC) 11/17/2018    Past Surgical History:  Procedure Laterality Date  . CESAREAN SECTION    . HERNIA REPAIR    . OOPHORECTOMY    . VARICOSE VEIN SURGERY       OB History   No obstetric history on file.     Family History  Problem Relation Age of Onset  . Anxiety disorder Mother   . Depression Mother   . Alcohol abuse Father   . Alcohol abuse Maternal Aunt   . Schizophrenia Maternal Uncle   . Anxiety disorder Sister   . Depression Sister   . Alcohol abuse Sister   . Anxiety disorder Sister   . Depression Sister     Social History   Tobacco Use  . Smoking status: Former Games developer  . Smokeless tobacco: Never Used    Home Medications Prior to Admission medications   Medication Sig Start Date End Date Taking? Authorizing Provider  Ascorbic Acid (VITAMIN C) 100 MG tablet Take 100 mg by mouth daily.    [provider]  aspirin EC 81 MG tablet Take 81 mg by mouth 2 (two) times a week.     [provider]  busPIRone (BUSPAR) 30 MG tablet Take 1 tablet (30 mg total) by mouth  2 (two) times daily. Take 1 Tablet by mouth Twice daily. 08/22/20   Corie Chiquito, PMHNP  calcium carbonate (OS-CAL - DOSED IN MG OF ELEMENTAL CALCIUM) 1250 (500 Ca) MG tablet Take 1 tablet by mouth.    [provider]  clorazepate (TRANXENE) 7.5 MG tablet Take 1 tablet (7.5 mg total) by mouth 2 (two) times daily. 09/19/20   Corie Chiquito, PMHNP  estradiol (ESTRACE) 0.5 MG tablet Take 0.5 mg by mouth daily.    [provider]  FLUoxetine (PROZAC) 20 MG capsule Take 3 capsules (60 mg total) by mouth daily. 12/12/20 03/12/21  Corie Chiquito, PMHNP  hydrochlorothiazide (HYDRODIURIL) 25 MG tablet Take 25 mg by mouth daily.     [provider]  ibuprofen (ADVIL,MOTRIN) 200 MG tablet Take 200 mg by mouth every 6 (six) hours as needed.    [provider]  Lactobacillus (ACIDOPHILUS PROBIOTIC) 10 MG TABS Take 10 mg by mouth 3 (three) times daily. 04/12/16   Everlene Farrier, PA-C  Multiple Vitamins-Minerals (HAIR SKIN AND NAILS FORMULA PO) Take by mouth.    [provider]  olmesartan (BENICAR) 20 MG tablet  10/16/18   [provider]  Omega-3 Fatty Acids (FISH OIL) 1000 MG CAPS Take by mouth.    [provider]  Vitamin D, Cholecalciferol, 10 MCG (400 UNIT) CAPS Take by mouth.    [provider]  vitamin E 100 UNIT capsule Take by mouth daily.    [provider]    Allergies    Cephalosporins, Latex, and Sulfa antibiotics  Review of Systems   Review of Systems  Constitutional: Positive for unexpected weight change. Negative for chills, diaphoresis and fever.  Respiratory: Negative for shortness of breath.   Cardiovascular: Positive for chest pain. Negative for palpitations and leg swelling.  Gastrointestinal: Negative for abdominal pain, nausea and vomiting.  Musculoskeletal: Negative for arthralgias and myalgias.  Skin: Negative for rash and wound.  Allergic/Immunologic: Negative for immunocompromised state.  Neurological: Negative for weakness and numbness.  Hematological: Negative for adenopathy.  Psychiatric/Behavioral: Negative for confusion.  All other systems reviewed and are negative.   Physical Exam Updated Vital Signs BP (!) 150/99 (BP Location: Right Arm)   Pulse 69   Temp 98.8 F (37.1 C) (Oral)   Resp 18   Ht 5\' 3"  (1.6 m)   Wt 50.3 kg   SpO2 100%   BMI 19.66 kg/m   Physical Exam Vitals and nursing note reviewed.  Constitutional:      General: She is not in acute distress.    Appearance: She is well-developed. She is not diaphoretic.  HENT:     Head: Normocephalic and atraumatic.  Cardiovascular:     Rate and Rhythm:  Normal rate and regular rhythm.     Heart sounds: Normal heart sounds.  Pulmonary:     Effort: Pulmonary effort is normal.     Breath sounds: Normal breath sounds.  Chest:     Chest wall: Tenderness present. No mass.  Breasts:     Left: No axillary adenopathy or supraclavicular adenopathy.     Musculoskeletal:     Cervical back: Neck supple.     Right lower leg: No edema.     Left lower leg: No edema.  Lymphadenopathy:     Upper Body:     Left upper body: No supraclavicular or axillary adenopathy.  Skin:    General: Skin is warm and dry.     Findings: No erythema or rash.  Neurological:  Mental Status: She is alert and oriented to person, place, and time.  Psychiatric:        Behavior: Behavior normal.     ED Results / Procedures / Treatments   Labs (all labs ordered are listed, but only abnormal results are displayed) Labs Reviewed  COMPREHENSIVE METABOLIC PANEL - Abnormal; Notable for the following components:      Result Value   Glucose, Bld 104 (*)    BUN 25 (*)    All other components within normal limits  CBC  TROPONIN I (HIGH SENSITIVITY)    EKG EKG Interpretation  Date/Time:  Saturday May 11 2021 11:23:11 EDT Ventricular Rate:  62 PR Interval:  140 QRS Duration: 88 QT Interval:  426 QTC Calculation: 432 R Axis:   44 Text Interpretation: Normal sinus rhythm Nonspecific T wave abnormality Abnormal ECG No old tracing to compare Confirmed by Mancel Bale [id number]) on 05/11/2021 1:14:33 PM   Radiology DG Chest 2 View  Result Date: 05/11/2021 CLINICAL DATA:  Acute chest pain for several days. EXAM: CHEST - 2 VIEW COMPARISON:  12/09/2017 and prior studies FINDINGS: The cardiomediastinal silhouette is unremarkable. There is no evidence of focal airspace disease, pulmonary edema, suspicious pulmonary nodule/mass, pleural effusion, or pneumothorax. No acute bony abnormalities are identified. IMPRESSION: No active cardiopulmonary disease. Electronically Signed    By: Harmon Pier M.D.   On: 05/11/2021 12:03    Procedures Procedures   Medications Ordered in ED Medications - No data to display  ED Course  I have reviewed the triage vital signs and the nursing notes.  Pertinent labs & imaging results that were available during my care of the patient were reviewed by me and considered in my medical decision making (see chart for details).  Clinical Course as of 05/11/21 1322  Sat May 11, 2021  3349 65 year old female with complaint of left sided throbbing discomfort for the past 2 to 3 weeks as described above.  On exam has palpation reproducing tenderness left area without axillary or supraclavicular lymph nodes.   Also reports concern for unintentional weight loss, medical records reviewed through the [facility], not due for colonoscopy for 2 more years however discussed importance of follow-up with PCP/GI for further work-up for this concern. Results today are reassuring including EKG with nonspecific T wave changes, troponin of 2 with no significant findings on CBC and CMP, hear score is 3. Recommend patient follow-up with her primary care provider for further work-up and return to ED for new or worsening symptoms. [LM]    Clinical Course User Index [LM] Alden Hipp   MDM Rules/Calculators/A&P HEAR Score: 3                       Final Clinical Impression(s) / ED Diagnoses Final diagnoses:  Atypical chest pain  Unintentional weight loss    Rx / DC Orders ED Discharge Orders    None       Jeannie Fend, PA-C 05/11/21 1322    Mancel Bale, MD 05/12/21 [phone]

## 2021-05-11 NOTE — Discharge Instructions (Addendum)
Follow up with your primary care provider as discussed for further work up. Return to the ER for new or worsening symptoms.

## 2021-07-15 ENCOUNTER — Other Ambulatory Visit: Payer: Self-pay

## 2021-07-15 ENCOUNTER — Emergency Department (HOSPITAL_BASED_OUTPATIENT_CLINIC_OR_DEPARTMENT_OTHER)
Admission: EM | Admit: 2021-07-15 | Discharge: 2021-07-15 | Disposition: A | Discharge status: Home / Self Care | Payer: BC Managed Care – PPO | Attending: Emergency Medicine | Admitting: Emergency Medicine

## 2021-07-15 ENCOUNTER — Encounter (HOSPITAL_BASED_OUTPATIENT_CLINIC_OR_DEPARTMENT_OTHER): Payer: Self-pay | Admitting: *Deleted

## 2021-07-15 ENCOUNTER — Emergency Department (HOSPITAL_BASED_OUTPATIENT_CLINIC_OR_DEPARTMENT_OTHER): Payer: BC Managed Care – PPO

## 2021-07-15 DIAGNOSIS — N3090 Cystitis, unspecified without hematuria: Secondary | ICD-10-CM

## 2021-07-15 DIAGNOSIS — R42 Dizziness and giddiness: Secondary | ICD-10-CM | POA: Diagnosis not present

## 2021-07-15 DIAGNOSIS — Z7982 Long term (current) use of aspirin: Secondary | ICD-10-CM | POA: Insufficient documentation

## 2021-07-15 DIAGNOSIS — F1290 Cannabis use, unspecified, uncomplicated: Secondary | ICD-10-CM | POA: Diagnosis not present

## 2021-07-15 DIAGNOSIS — I10 Essential (primary) hypertension: Secondary | ICD-10-CM | POA: Insufficient documentation

## 2021-07-15 DIAGNOSIS — Z91040 Latex allergy status: Secondary | ICD-10-CM | POA: Diagnosis not present

## 2021-07-15 DIAGNOSIS — R410 Disorientation, unspecified: Secondary | ICD-10-CM

## 2021-07-15 DIAGNOSIS — R4182 Altered mental status, unspecified: Secondary | ICD-10-CM | POA: Insufficient documentation

## 2021-07-15 DIAGNOSIS — F606 Avoidant personality disorder: Secondary | ICD-10-CM | POA: Insufficient documentation

## 2021-07-15 DIAGNOSIS — Z79899 Other long term (current) drug therapy: Secondary | ICD-10-CM | POA: Diagnosis not present

## 2021-07-15 DIAGNOSIS — Z87891 Personal history of nicotine dependence: Secondary | ICD-10-CM | POA: Insufficient documentation

## 2021-07-15 DIAGNOSIS — Y900 Blood alcohol level of less than 20 mg/100 ml: Secondary | ICD-10-CM | POA: Diagnosis not present

## 2021-07-15 HISTORY — DX: Anxiety disorder, unspecified: F41.9

## 2021-07-15 LAB — CBC WITH DIFFERENTIAL/PLATELET
Abs Immature Granulocytes: 0.01 10*3/uL (ref 0.00–0.07)
Basophils Absolute: 0 10*3/uL (ref 0.0–0.1)
Basophils Relative: 1 %
Eosinophils Absolute: 0.1 10*3/uL (ref 0.0–0.5)
Eosinophils Relative: 1 %
HCT: 40.5 % (ref 36.0–46.0)
Hemoglobin: 13.5 g/dL (ref 12.0–15.0)
Immature Granulocytes: 0 %
Lymphocytes Relative: 29 %
Lymphs Abs: 2.1 10*3/uL (ref 0.7–4.0)
MCH: 32.4 pg (ref 26.0–34.0)
MCHC: 33.3 g/dL (ref 30.0–36.0)
MCV: 97.1 fL (ref 80.0–100.0)
Monocytes Absolute: 0.6 10*3/uL (ref 0.1–1.0)
Monocytes Relative: 8 %
Neutro Abs: 4.3 10*3/uL (ref 1.7–7.7)
Neutrophils Relative %: 61 %
Platelets: 209 10*3/uL (ref 150–400)
RBC: 4.17 MIL/uL (ref 3.87–5.11)
RDW: 12.7 % (ref 11.5–15.5)
WBC: 7.1 10*3/uL (ref 4.0–10.5)
nRBC: 0 % (ref 0.0–0.2)

## 2021-07-15 LAB — COMPREHENSIVE METABOLIC PANEL
ALT: 26 U/L (ref 0–44)
AST: 25 U/L (ref 15–41)
Albumin: 4.4 g/dL (ref 3.5–5.0)
Alkaline Phosphatase: 50 U/L (ref 38–126)
Anion gap: 10 (ref 5–15)
BUN: 30 mg/dL — ABNORMAL HIGH (ref 8–23)
CO2: 28 mmol/L (ref 22–32)
Calcium: 9.6 mg/dL (ref 8.9–10.3)
Chloride: 101 mmol/L (ref 98–111)
Creatinine, Ser: 0.87 mg/dL (ref 0.44–1.00)
GFR, Estimated: >60 mL/min (ref >60)
Glucose, Bld: 89 mg/dL (ref 70–99)
Potassium: 3.7 mmol/L (ref 3.5–5.1)
Sodium: 139 mmol/L (ref 135–145)
Total Bilirubin: 0.7 mg/dL (ref 0.3–1.2)
Total Protein: 6.9 g/dL (ref 6.5–8.1)

## 2021-07-15 LAB — URINALYSIS, ROUTINE W REFLEX MICROSCOPIC
Bilirubin Urine: NEGATIVE
Glucose, UA: NEGATIVE mg/dL
Hgb urine dipstick: NEGATIVE
Ketones, ur: NEGATIVE mg/dL
Leukocytes,Ua: NEGATIVE
Nitrite: POSITIVE — AB
Protein, ur: NEGATIVE mg/dL
Specific Gravity, Urine: 1.015 (ref 1.005–1.030)
pH: 6 (ref 5.0–8.0)

## 2021-07-15 LAB — RAPID URINE DRUG SCREEN, HOSP PERFORMED
Amphetamines: NOT DETECTED
Barbiturates: NOT DETECTED
Benzodiazepines: NOT DETECTED
Cocaine: NOT DETECTED
Opiates: NOT DETECTED
Tetrahydrocannabinol: POSITIVE — AB

## 2021-07-15 LAB — ETHANOL: Alcohol, Ethyl (B): <10 mg/dL (ref <10)

## 2021-07-15 MED ORDER — NITROFURANTOIN MONOHYD MACRO 100 MG PO CAPS: 100.0000 mg | ORAL_CAPSULE | Freq: Two times a day (BID) | ORAL | 0 refills | Status: AC

## 2021-07-15 NOTE — ED Provider Notes (Signed)
Care of the patient assumed at the change of shift. Here for an episode of AMS/confusion. Has cleared now. Awaiting UA.  Physical Exam  BP 118/74   Pulse 79   Temp 97.7 F (36.5 C) (Oral)   Resp 15   Ht 5\' 9"  (1.753 m)   Wt 52.6 kg   SpO2 97%   BMI 17.13 kg/m   Physical Exam  ED Course/Procedures     Procedures  MDM  UA with Nitrites, WBC and bacteria. She has not had dysuria but has noted that her urine has been more dark recently. Will treat with Keflex, recommend close PCP follow up. RTED for any other concerns. Patient and husband amenable to this plan.        , MD 07/15/21 07/17/21

## 2021-07-15 NOTE — ED Triage Notes (Signed)
Husband brought pt to ED after finding her altered with thought process, pt  does have some slightly slurred speech and can not tell me her DOB. Not physical neuro deficits noted. After getting pt to room, c/o bottom lip not feeling right. Pt without slurred speech at this time and able to tell writer name and date of birth. Husband states she took some CBD oil. Last time he saw her normal was 1130.

## 2021-07-15 NOTE — ED Notes (Signed)
Pt states she feels buzzed, signed consent waiver for triage part but could not remember what she signed. Pt sleepy at present time.

## 2021-07-15 NOTE — ED Provider Notes (Signed)
[facility] EMERGENCY DEPT Provider Note   CSN: [id number] Arrival date & time: 07/15/21  1340     History Chief Complaint  Patient presents with   Stroke Symptoms    Amber Pratt is a 65 y.o. female.  65 year old female with past medical history below including anxiety/depression, OCD, memory loss, hypertension who presents with altered mental status.  Husband states that patient was in her usual state of health yesterday and this morning until he left the house at 1130.  When he got back at 130, patient seemed confused and disoriented.  She had mentioned that while the landscaper was overdoing work, he had given her some CBD cream to put on her wrists.  Husband is not sure what this medication was and she was not able to find it at the house.  He notes that she had also been on the phone with a family member regarding her mother's estate.  Her mother passed away in 2023/06/01 and since then she has had a lot of anxiety and stress when dealing with her mother's death.  She ended up in the ED with chest pain in June 01, 2023 shortly after something having to do with her mother's house.  Husband wonders if this may be playing a role in her current symptoms.  Aside from disorientation/confusion, he has not noticed any facial droop, aphasia, or unilateral weakness.  She did seem slightly off balance.  Patient states she feels dizzy.  She reported that her lips felt funny earlier.  She denies any complaints of pain and denies any double vision.  Husband notes that patient has been having ongoing problems w/ memory loss and is scheduled to see neurologist later this year for dementia work up.   LEVEL 5 CAVEAT DUE TO AMS  The history is provided by the spouse.      Past Medical History:  Diagnosis Date   Anxiety    Hypertension    Varicose veins of both lower extremities     Patient Active Problem List   Diagnosis Date Noted   Mixed obsessional thoughts and acts 11/17/2018   Major  depressive disorder, recurrent episode, moderate (HCC) 11/17/2018    Past Surgical History:  Procedure Laterality Date   CESAREAN SECTION     HERNIA REPAIR     OOPHORECTOMY     VARICOSE VEIN SURGERY       OB History   No obstetric history on file.     Family History  Problem Relation Age of Onset   Anxiety disorder Mother    Depression Mother    Alcohol abuse Father    Alcohol abuse Maternal Aunt    Schizophrenia Maternal Uncle    Anxiety disorder Sister    Depression Sister    Alcohol abuse Sister    Anxiety disorder Sister    Depression Sister     Social History   Tobacco Use   Smoking status: Former   Smokeless tobacco: Never  Substance Use Topics   Alcohol use: Yes   Drug use: Never    Home Medications Prior to Admission medications   Medication Sig Start Date End Date Taking? Authorizing Provider  Ascorbic Acid (VITAMIN C) 100 MG tablet Take 100 mg by mouth daily.    [provider]  aspirin EC 81 MG tablet Take 81 mg by mouth 2 (two) times a week.     [provider]  busPIRone (BUSPAR) 30 MG tablet Take 1 tablet (30 mg total) by mouth 2 (two)  times daily. Take 1 Tablet by mouth Twice daily. 08/22/20   Corie Chiquito, PMHNP  calcium carbonate (OS-CAL - DOSED IN MG OF ELEMENTAL CALCIUM) 1250 (500 Ca) MG tablet Take 1 tablet by mouth.    [provider]  clorazepate (TRANXENE) 7.5 MG tablet Take 1 tablet (7.5 mg total) by mouth 2 (two) times daily. 09/19/20   Corie Chiquito, PMHNP  estradiol (ESTRACE) 0.5 MG tablet Take 0.5 mg by mouth daily.    [provider]  FLUoxetine (PROZAC) 20 MG capsule Take 3 capsules (60 mg total) by mouth daily. 12/12/20 03/12/21  Corie Chiquito, PMHNP  hydrochlorothiazide (HYDRODIURIL) 25 MG tablet Take 25 mg by mouth daily.    [provider]  ibuprofen (ADVIL,MOTRIN) 200 MG tablet Take 200 mg by mouth every 6 (six) hours as needed.    [provider]  Lactobacillus (ACIDOPHILUS  PROBIOTIC) 10 MG TABS Take 10 mg by mouth 3 (three) times daily. 04/12/16   Everlene Farrier, PA-C  Multiple Vitamins-Minerals (HAIR SKIN AND NAILS FORMULA PO) Take by mouth.    [provider]  olmesartan (BENICAR) 20 MG tablet  10/16/18   [provider]  Omega-3 Fatty Acids (FISH OIL) 1000 MG CAPS Take by mouth.    [provider]  Vitamin D, Cholecalciferol, 10 MCG (400 UNIT) CAPS Take by mouth.    [provider]  vitamin E 100 UNIT capsule Take by mouth daily.    [provider]    Allergies    Cephalosporins, Latex, and Sulfa antibiotics  Review of Systems   Review of Systems  Unable to perform ROS: Mental status change   Physical Exam Updated Vital Signs BP 136/72   Pulse 82   Temp 97.7 F (36.5 C) (Oral)   Resp 17   Ht 5\' 9"  (1.753 m)   Wt 52.6 kg   SpO2 98%   BMI 17.13 kg/m   Physical Exam Vitals and nursing note reviewed.  Constitutional:      General: She is not in acute distress.    Appearance: She is well-developed.     Comments: Awake, alert  HENT:     Head: Normocephalic and atraumatic.     Mouth/Throat:     Mouth: Mucous membranes are moist.     Pharynx: Oropharynx is clear.  Eyes:     Extraocular Movements: Extraocular movements intact.     Conjunctiva/sclera: Conjunctivae normal.     Pupils: Pupils are equal, round, and reactive to light.  Cardiovascular:     Rate and Rhythm: Normal rate and regular rhythm.     Heart sounds: Normal heart sounds. No murmur heard. Pulmonary:     Effort: Pulmonary effort is normal. No respiratory distress.     Breath sounds: Normal breath sounds.  Abdominal:     General: Bowel sounds are normal. There is no distension.     Palpations: Abdomen is soft.     Tenderness: There is no abdominal tenderness.  Musculoskeletal:     Cervical back: Neck supple.  Skin:    General: Skin is warm and dry.  Neurological:     Mental Status: She is alert.     Cranial Nerves: No cranial  nerve deficit.     Motor: No abnormal muscle tone.     Deep Tendon Reflexes: Reflexes are normal and symmetric. Reflexes normal.     Comments: Alert, oriented x 2, Fluent speech, normal finger-to-nose testing, negative pronator drift, no clonus, normal heel-to-shin testing 5/5 strength and normal  sensation x all 4 extremities  Psychiatric:     Comments: Mildly anxious, disoriented    ED Results / Procedures / Treatments   Labs (all labs ordered are listed, but only abnormal results are displayed) Labs Reviewed  COMPREHENSIVE METABOLIC PANEL - Abnormal; Notable for the following components:      Result Value   BUN 30 (*)    All other components within normal limits  ETHANOL  CBC WITH DIFFERENTIAL/PLATELET  URINALYSIS, ROUTINE W REFLEX MICROSCOPIC  RAPID URINE DRUG SCREEN, HOSP PERFORMED    EKG EKG Interpretation  Date/Time:  Monday July 15 2021 13:46:55 EDT Ventricular Rate:  87 PR Interval:  136 QRS Duration: 97 QT Interval:  395 QTC Calculation: 476 R Axis:   26 Text Interpretation: Sinus rhythm Minimal ST depression, anterolateral leads some artifact but overall similar to previous Confirmed by Frederick Peers [phone]) on 07/15/2021 2:02:35 PM  Radiology CT Head Wo Contrast  Result Date: 07/15/2021 CLINICAL DATA:  Mental status change Slurred speech EXAM: CT HEAD WITHOUT CONTRAST TECHNIQUE: Contiguous axial images were obtained from the base of the skull through the vertex without intravenous contrast. COMPARISON:  None. FINDINGS: Brain: No evidence of acute infarction, hemorrhage, hydrocephalus, extra-axial collection or mass lesion/mass effect. Vascular: No hyperdense vessel or unexpected calcification. Skull: Normal. Negative for fracture or focal lesion. Sinuses/Orbits: No acute finding. Other: None. IMPRESSION: No acute intracranial abnormality. Electronically Signed   By: Acquanetta Belling M.D.   On: 07/15/2021 15:03    Procedures Procedures   Medications Ordered in  ED Medications - No data to display  ED Course  I have reviewed the triage vital signs and the nursing notes.  Pertinent labs & imaging results that were available during my care of the patient were reviewed by me and considered in my medical decision making (see chart for details).    MDM Rules/Calculators/A&P                           Patient was disoriented on exam but able to follow commands and demonstrated full strength throughout, normal coordination, and no focal deficits to suggest acute stroke.  Based on her normal neurologic exam and hx of confusion, I do not feel her current presentation represents TIA or stroke.  Did obtain head CT which was reassuring.  Lab work shows normal CMP, CBC, ethanol.  We will obtain UA and UDS.  Husband feels fairly confident that patient's symptoms may have to do with the CBD lotion she used earlier and/or anxiety as she has had previous ED visit related to anxiety.  I have extensively counseled on need for close PCP and psychiatrist follow-up.  Patient signed out pending urine results. Final Clinical Impression(s) / ED Diagnoses Final diagnoses:  None    Rx / DC Orders ED Discharge Orders     None        [place] Natarajan, Ambrose [place], MD 07/15/21 [phone]

## 2022-10-24 ENCOUNTER — Emergency Department (HOSPITAL_BASED_OUTPATIENT_CLINIC_OR_DEPARTMENT_OTHER)
Admission: EM | Admit: 2022-10-24 | Discharge: 2022-10-24 | Disposition: A | Discharge status: Home / Self Care | Payer: Medicare Other | Attending: Emergency Medicine | Admitting: Emergency Medicine

## 2022-10-24 ENCOUNTER — Other Ambulatory Visit: Payer: Self-pay

## 2022-10-24 ENCOUNTER — Encounter (HOSPITAL_BASED_OUTPATIENT_CLINIC_OR_DEPARTMENT_OTHER): Payer: Self-pay

## 2022-10-24 ENCOUNTER — Emergency Department (HOSPITAL_BASED_OUTPATIENT_CLINIC_OR_DEPARTMENT_OTHER): Payer: Medicare Other

## 2022-10-24 DIAGNOSIS — R1032 Left lower quadrant pain: Secondary | ICD-10-CM | POA: Diagnosis not present

## 2022-10-24 DIAGNOSIS — Z7982 Long term (current) use of aspirin: Secondary | ICD-10-CM | POA: Insufficient documentation

## 2022-10-24 DIAGNOSIS — R079 Chest pain, unspecified: Secondary | ICD-10-CM

## 2022-10-24 DIAGNOSIS — Z79899 Other long term (current) drug therapy: Secondary | ICD-10-CM | POA: Insufficient documentation

## 2022-10-24 DIAGNOSIS — R002 Palpitations: Secondary | ICD-10-CM | POA: Insufficient documentation

## 2022-10-24 DIAGNOSIS — Z91040 Latex allergy status: Secondary | ICD-10-CM | POA: Diagnosis not present

## 2022-10-24 DIAGNOSIS — R413 Other amnesia: Secondary | ICD-10-CM | POA: Insufficient documentation

## 2022-10-24 DIAGNOSIS — R0789 Other chest pain: Secondary | ICD-10-CM | POA: Insufficient documentation

## 2022-10-24 DIAGNOSIS — R008 Other abnormalities of heart beat: Secondary | ICD-10-CM | POA: Diagnosis present

## 2022-10-24 LAB — COMPREHENSIVE METABOLIC PANEL
ALT: 23 U/L (ref 0–44)
AST: 23 U/L (ref 15–41)
Albumin: 4.7 g/dL (ref 3.5–5.0)
Alkaline Phosphatase: 52 U/L (ref 38–126)
Anion gap: 11 (ref 5–15)
BUN: 29 mg/dL — ABNORMAL HIGH (ref 8–23)
CO2: 28 mmol/L (ref 22–32)
Calcium: 9.8 mg/dL (ref 8.9–10.3)
Chloride: 99 mmol/L (ref 98–111)
Creatinine, Ser: 0.76 mg/dL (ref 0.44–1.00)
GFR, Estimated: >60 mL/min (ref >60)
Glucose, Bld: 86 mg/dL (ref 70–99)
Potassium: 3.7 mmol/L (ref 3.5–5.1)
Sodium: 138 mmol/L (ref 135–145)
Total Bilirubin: 0.6 mg/dL (ref 0.3–1.2)
Total Protein: 7.9 g/dL (ref 6.5–8.1)

## 2022-10-24 LAB — URINALYSIS, ROUTINE W REFLEX MICROSCOPIC
Bilirubin Urine: NEGATIVE
Glucose, UA: NEGATIVE mg/dL
Hgb urine dipstick: NEGATIVE
Ketones, ur: NEGATIVE mg/dL
Leukocytes,Ua: NEGATIVE
Nitrite: NEGATIVE
Protein, ur: NEGATIVE mg/dL
Specific Gravity, Urine: 1.007 (ref 1.005–1.030)
pH: 7.5 (ref 5.0–8.0)

## 2022-10-24 LAB — CBC
HCT: 43.7 % (ref 36.0–46.0)
Hemoglobin: 14.7 g/dL (ref 12.0–15.0)
MCH: 32.5 pg (ref 26.0–34.0)
MCHC: 33.6 g/dL (ref 30.0–36.0)
MCV: 96.5 fL (ref 80.0–100.0)
Platelets: 211 10*3/uL (ref 150–400)
RBC: 4.53 MIL/uL (ref 3.87–5.11)
RDW: 12.7 % (ref 11.5–15.5)
WBC: 6.6 10*3/uL (ref 4.0–10.5)
nRBC: 0 % (ref 0.0–0.2)

## 2022-10-24 LAB — TROPONIN I (HIGH SENSITIVITY): Troponin I (High Sensitivity): 2 ng/L (ref <18)

## 2022-10-24 LAB — LIPASE, BLOOD: Lipase: 47 U/L (ref 11–51)

## 2022-10-24 NOTE — ED Provider Notes (Signed)
[facility] EMERGENCY DEPT Provider Note   CSN: [id number] Arrival date & time: 10/24/22  1131     History  Chief Complaint  Patient presents with   Abdominal Pain   Irregular Heart Beat    Amber Pratt is a 66 y.o. female.  Pt is here with her husband.  He provides a significant portion of patient's history.  He reports that the patient called him today and complained that she was having some chest discomfort and that her heart was beating irregularly.  Patient reports she has had a history of a recent episode of left lower abdominal pain.  Patient reports she felt like she had a lump in this area.  Patient reports experiencing constipation.  Patient reports lump and discomfort resolved after having a bowel movement.  Reports she is not currently having any pain.  States she is not having any chest pain she reports she is not experiencing palpitations currently.  Patient's husband is concerned with the patient's memory loss and confusion.  Patient had an assessment done by neuro psychology in May but she was unable to comply being due to anxiety.  The history is provided by the patient. No language interpreter was used.  Chest Pain Pain location:  Unable to specify Pain quality: aching   Pain radiates to:  Does not radiate Pain severity:  No pain Timing:  Constant Progression:  Worsening Chronicity:  New Relieved by:  Nothing Worsened by:  Nothing Ineffective treatments:  None tried Associated symptoms: no weakness   Risk factors: no hypertension        Home Medications Prior to Admission medications   Medication Sig Start Date End Date Taking? Authorizing Provider  Ascorbic Acid (VITAMIN C) 100 MG tablet Take 100 mg by mouth daily.    [provider]  aspirin EC 81 MG tablet Take 81 mg by mouth 2 (two) times a week.     [provider]  busPIRone (BUSPAR) 30 MG tablet Take 1 tablet (30 mg total) by mouth 2 (two) times daily. Take 1 Tablet  by mouth Twice daily. 08/22/20   Thayer Headings, PMHNP  calcium carbonate (OS-CAL - DOSED IN MG OF ELEMENTAL CALCIUM) 1250 (500 Ca) MG tablet Take 1 tablet by mouth.    [provider]  clorazepate (TRANXENE) 7.5 MG tablet Take 1 tablet (7.5 mg total) by mouth 2 (two) times daily. 09/19/20   Thayer Headings, PMHNP  estradiol (ESTRACE) 0.5 MG tablet Take 0.5 mg by mouth daily.    [provider]  FLUoxetine (PROZAC) 20 MG capsule Take 3 capsules (60 mg total) by mouth daily. 12/12/20 03/12/21  Thayer Headings, PMHNP  hydrochlorothiazide (HYDRODIURIL) 25 MG tablet Take 25 mg by mouth daily.    [provider]  ibuprofen (ADVIL,MOTRIN) 200 MG tablet Take 200 mg by mouth every 6 (six) hours as needed.    [provider]  Lactobacillus (ACIDOPHILUS PROBIOTIC) 10 MG TABS Take 10 mg by mouth 3 (three) times daily. 04/12/16   Waynetta Pean, PA-C  Multiple Vitamins-Minerals (HAIR SKIN AND NAILS FORMULA PO) Take by mouth.    [provider]  nitrofurantoin, macrocrystal-monohydrate, (MACROBID) 100 MG capsule Take 1 capsule (100 mg total) by mouth 2 (two) times daily. 07/15/21   Truddie Hidden, MD  olmesartan (BENICAR) 20 MG tablet  10/16/18   [provider]  Omega-3 Fatty Acids (FISH OIL) 1000 MG CAPS Take by mouth.    [provider]  Vitamin D, Cholecalciferol, 10 MCG (400  UNIT) CAPS Take by mouth.    [provider]  vitamin E 100 UNIT capsule Take by mouth daily.    [provider]      Allergies    Cephalosporins, Latex, and Sulfa antibiotics    Review of Systems   Review of Systems  Cardiovascular:  Positive for chest pain.  Neurological:  Negative for weakness.  All other systems reviewed and are negative.   Physical Exam Updated Vital Signs BP (!) 149/87   Pulse 75   Temp 97.8 F (36.6 C) (Oral)   Resp 15   Ht 5\' 9"  (1.753 m)   Wt 52.6 kg   SpO2 97%   BMI 17.12 kg/m  Physical Exam Vitals reviewed.   Constitutional:      Appearance: She is well-developed.  HENT:     Mouth/Throat:     Mouth: Mucous membranes are moist.  Eyes:     Extraocular Movements: Extraocular movements intact.     Pupils: Pupils are equal, round, and reactive to light.  Cardiovascular:     Rate and Rhythm: Normal rate and regular rhythm.  Pulmonary:     Effort: Pulmonary effort is normal.  Abdominal:     General: Abdomen is flat. Bowel sounds are increased.     Palpations: Abdomen is soft.     Tenderness: There is no abdominal tenderness.     Hernia: No hernia is present.  Skin:    General: Skin is warm.  Neurological:     Mental Status: She is alert.     Comments: Pt does not know day of the week or Month   Psychiatric:        Mood and Affect: Mood is anxious.     ED Results / Procedures / Treatments   Labs (all labs ordered are listed, but only abnormal results are displayed) Labs Reviewed  COMPREHENSIVE METABOLIC PANEL - Abnormal; Notable for the following components:      Result Value   BUN 29 (*)    All other components within normal limits  URINALYSIS, ROUTINE W REFLEX MICROSCOPIC - Abnormal; Notable for the following components:   Color, Urine COLORLESS (*)    All other components within normal limits  LIPASE, BLOOD  CBC  TROPONIN I (HIGH SENSITIVITY)    EKG None  Radiology No results found.  Procedures Procedures    Medications Ordered in ED Medications - No data to display  ED Course/ Medical Decision Making/ A&P                           Medical Decision Making Pt brought here by her husband for palpitations and chest discomfort.   Pt also concerned about constipation.  Pt's husband concerned about  pt's memory loss and cognitive issues   Amount and/or Complexity of Data Reviewed Independent Historian: spouse    Details: Husband reports pt is having confusion and memory issues for several months  External Data Reviewed: notes.    Details: Neuropsych notes reviewed.   Labs: ordered. Decision-making details documented in ED Course.    Details: Troponin is negative   Labs ordered reviewed and interpreted  ECG/medicine tests: ordered and independent interpretation performed. Decision-making details documented in ED Course.    Details: EKG no acute abnormality    Risk Risk Details: Dr. in to see and evaluate pt.  MRi brain offered.  Pt and husband refused.  Pt's troponin is normal x 1.  Husband has  made an appointment to see Neuropsych for recheck.  Pt does not have an sign of acute heart issue or acute abdominal issue.  I offered neurology referral.  Pt's husband wants to discuss with primary care.            Final Clinical Impression(s) / ED Diagnoses Final diagnoses:  Palpitations  Nonspecific chest pain    Rx / DC Orders ED Discharge Orders     None      An After Visit Summary was printed and given to the patient.    Elson Areas, PA-C 10/24/22 1422    Franne Forts, DO 10/29/22 [phone]

## 2022-10-24 NOTE — ED Triage Notes (Signed)
Pt states having abdominal pain radiating to left side into back. Also states she is having an irregular heart rate as well. States her pain began a month ago, states she is having problems with irregular heart rate and her heart feels like it is flip flopping back and forth. Pt's husband states she has had cognitive issues as well. States she has been having a few episodes with her heart lately.

## 2023-06-13 IMAGING — CT CT HEAD W/O CM
4 series · 17 of 47 positions shown, 19 images · non-contrast
Comparison: None.

CLINICAL DATA: Mental status change

Slurred speech
EXAM:
CT HEAD WITHOUT CONTRAST
TECHNIQUE: Contiguous axial images were obtained from the base of the skull
through the vertex without intravenous contrast.

[Series 2: head wo · axial · 0.44mm/px · z∈[-429,-309]mm · 7 of 33 slices shown, 9 images]
[im 5/33  brain]
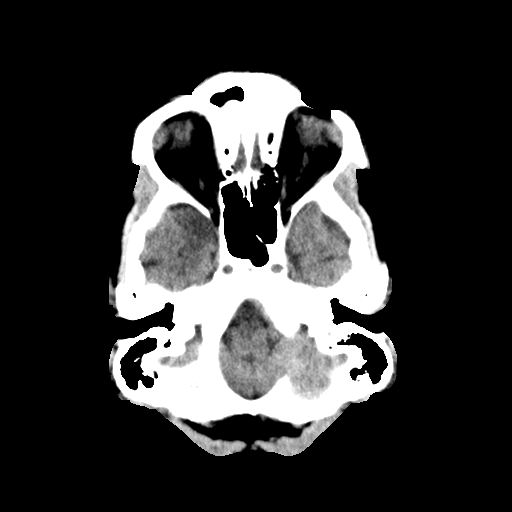
[im 5/33  bone]
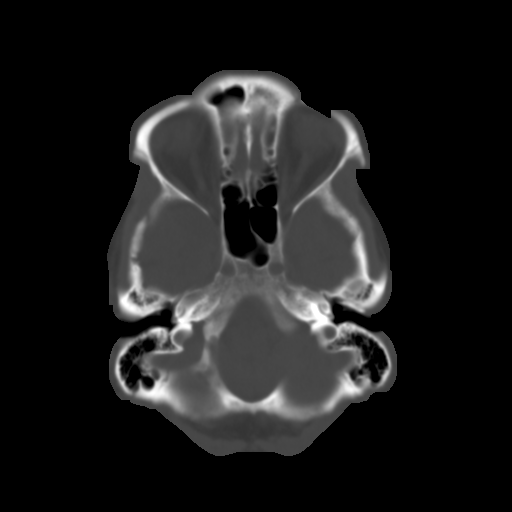
[im 9/33  brain]
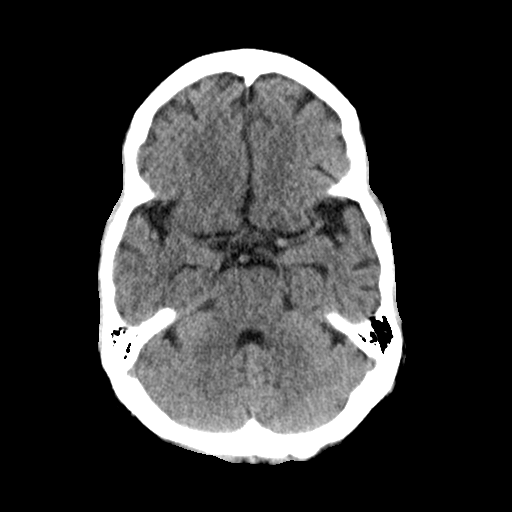
[im 13/33  brain]
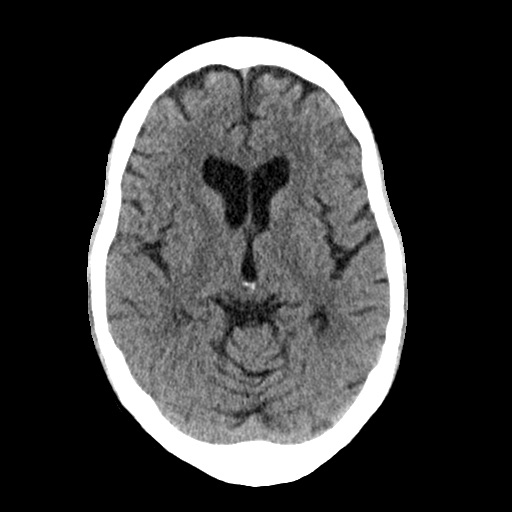
[im 17/33  brain]
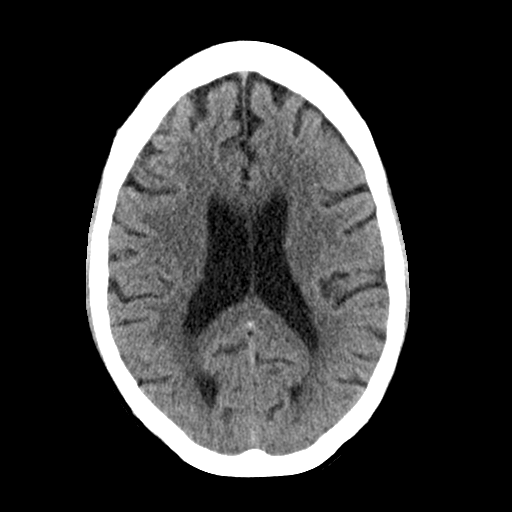
[im 21/33  brain]
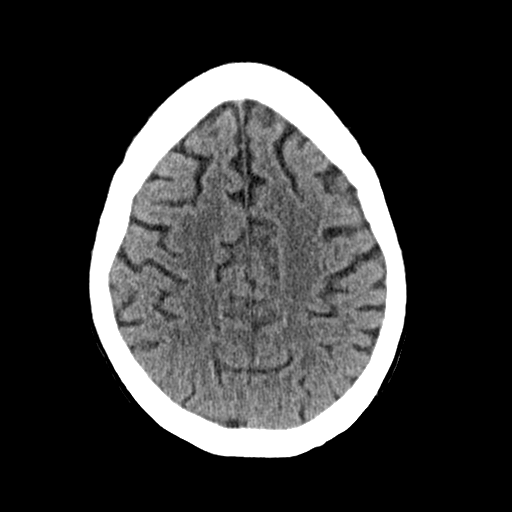
[im 21/33  bone]
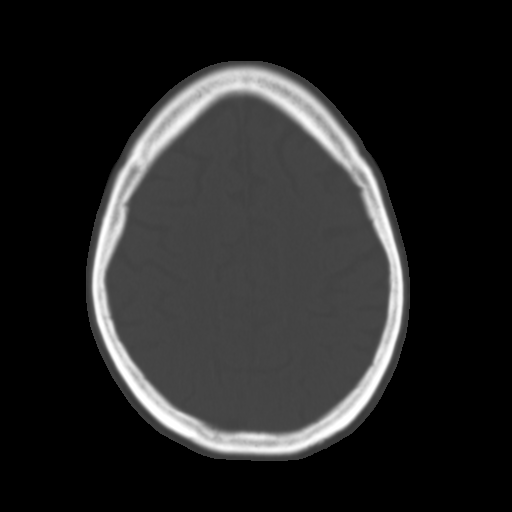
[im 25/33  brain]
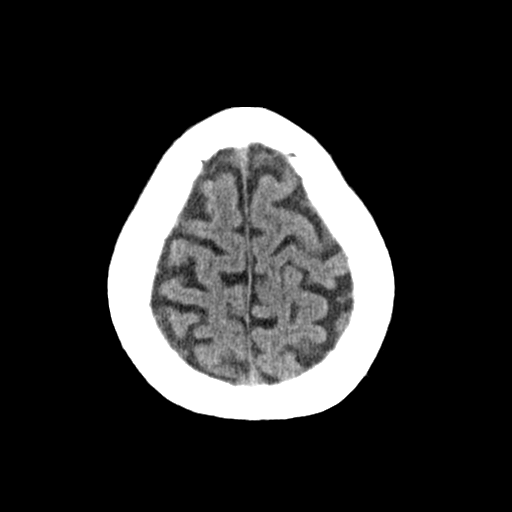
[im 29/33  brain]
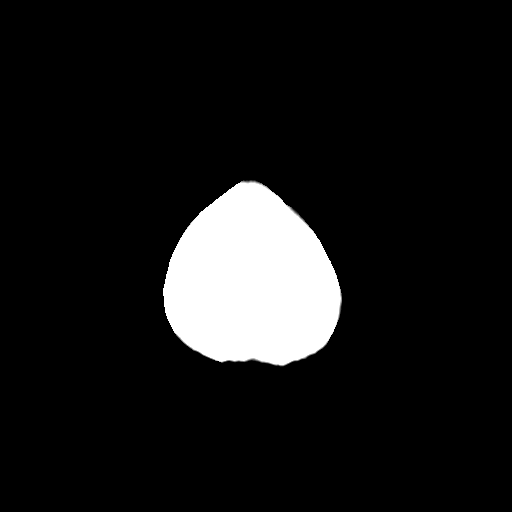

[Series 3: head bone · axial · 0.44mm/px · z∈[-433,-377]mm · 4 of 81 slices shown]
[im 9/81  bone]
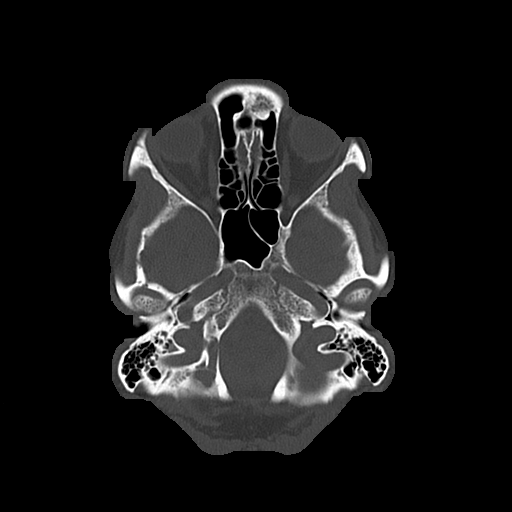
[im 17/81  bone]
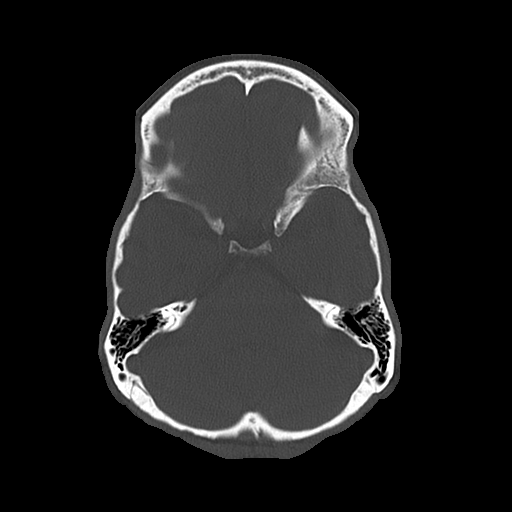
[im 25/81  bone]
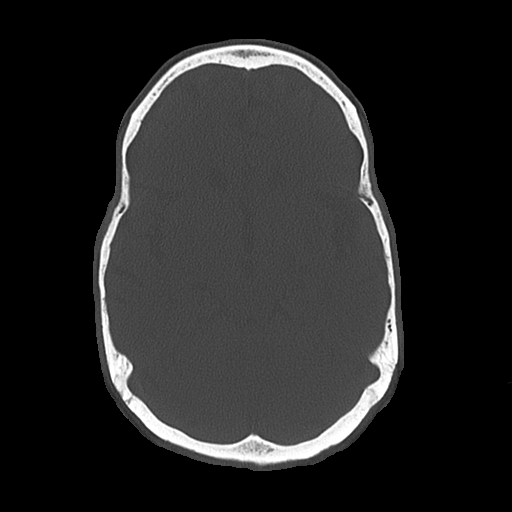
[im 37/81  bone]
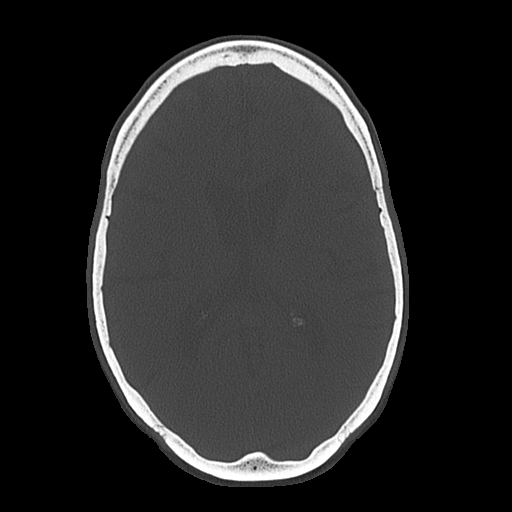

[Series 4: coronal soft · coronal · 0.33mm/px · 3 of 72 slices shown]
[im 26/72  brain]
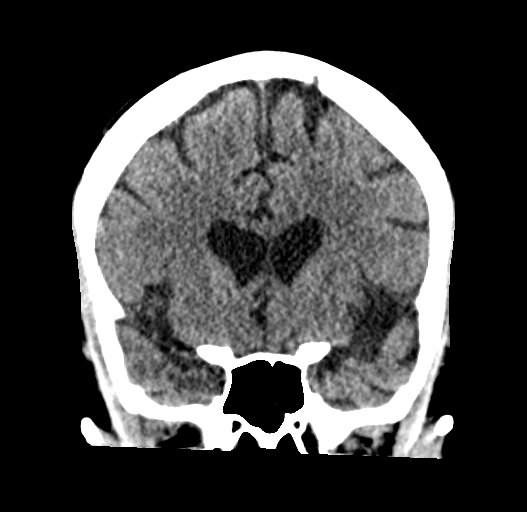
[im 33/72  brain]
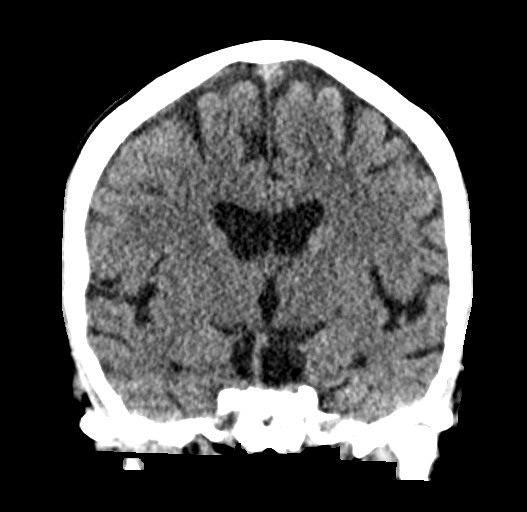
[im 40/72  brain]
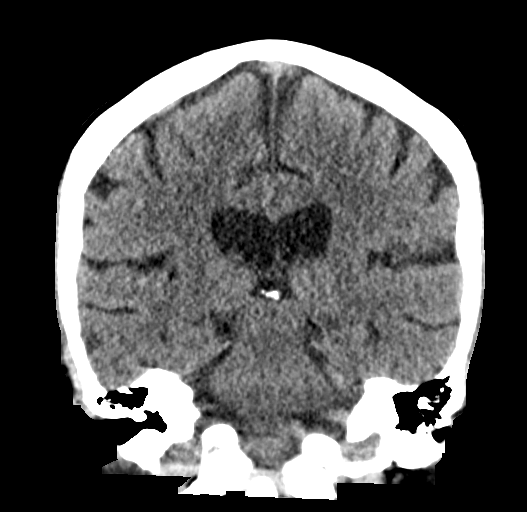

[Series 5: sagittal soft · sagittal · 0.33mm/px · 3 of 58 slices shown]
[im 20/58  brain]
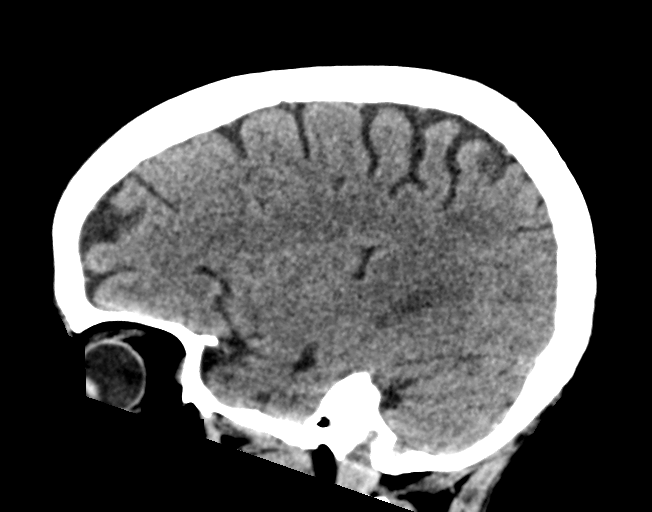
[im 29/58  brain]
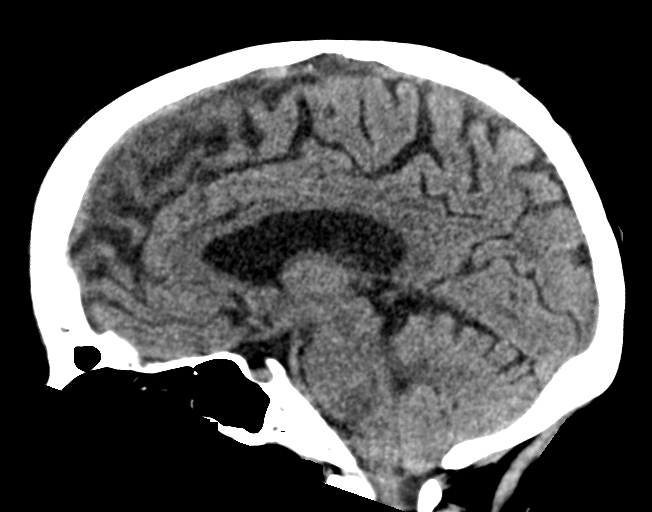
[im 39/58  brain]
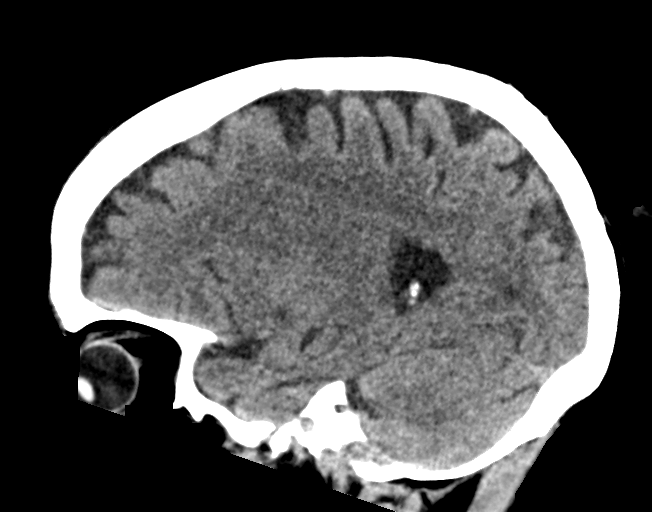

[17 of 47 positions shown; findings below may reference images not displayed]

FINDINGS: Brain: No evidence of acute infarction, hemorrhage, hydrocephalus,
extra-axial collection or mass lesion/mass effect.

Vascular: No hyperdense vessel or unexpected calcification.

Skull: Normal. Negative for fracture or focal lesion.

Sinuses/Orbits: No acute finding.

Other: None.
IMPRESSION: No acute intracranial abnormality.

## 2023-11-04 ENCOUNTER — Encounter: Payer: Self-pay | Admitting: Psychiatry
